# Patient Record
Sex: Male | Born: 1990 | Race: White | Hispanic: No | Marital: Single | Smoking: Former smoker
Health system: Southern US, Community
[De-identification: ages and names within clinical notes are randomized; demographics above are authoritative.]

## PROBLEM LIST (undated history)

## (undated) DIAGNOSIS — J45909 Unspecified asthma, uncomplicated: Secondary | ICD-10-CM

## (undated) DIAGNOSIS — B019 Varicella without complication: Secondary | ICD-10-CM

## (undated) DIAGNOSIS — K589 Irritable bowel syndrome without diarrhea: Secondary | ICD-10-CM

## (undated) DIAGNOSIS — J302 Other seasonal allergic rhinitis: Secondary | ICD-10-CM

## (undated) DIAGNOSIS — G43909 Migraine, unspecified, not intractable, without status migrainosus: Secondary | ICD-10-CM

## (undated) DIAGNOSIS — K219 Gastro-esophageal reflux disease without esophagitis: Secondary | ICD-10-CM

## (undated) HISTORY — DX: Migraine, unspecified, not intractable, without status migrainosus: G43.909

## (undated) HISTORY — DX: Other seasonal allergic rhinitis: J30.2

## (undated) HISTORY — DX: Unspecified asthma, uncomplicated: J45.909

## (undated) HISTORY — DX: Gastro-esophageal reflux disease without esophagitis: K21.9

## (undated) HISTORY — DX: Varicella without complication: B01.9

## (undated) HISTORY — PX: ELBOW SURGERY: SHX618

## (undated) HISTORY — DX: Irritable bowel syndrome without diarrhea: K58.9

---
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
## 2012-04-19 ENCOUNTER — Emergency Department: Payer: Self-pay | Admitting: Emergency Medicine

## 2012-07-23 ENCOUNTER — Emergency Department: Payer: Self-pay | Admitting: Emergency Medicine

## 2012-11-02 ENCOUNTER — Ambulatory Visit: Payer: Self-pay | Admitting: Internal Medicine

## 2017-05-28 ENCOUNTER — Ambulatory Visit: Payer: Self-pay | Admitting: Primary Care

## 2017-06-15 ENCOUNTER — Encounter (INDEPENDENT_AMBULATORY_CARE_PROVIDER_SITE_OTHER): Payer: Self-pay

## 2017-06-15 ENCOUNTER — Ambulatory Visit (INDEPENDENT_AMBULATORY_CARE_PROVIDER_SITE_OTHER): Payer: BLUE CROSS/BLUE SHIELD | Admitting: Primary Care

## 2017-06-15 ENCOUNTER — Encounter: Payer: Self-pay | Admitting: Primary Care

## 2017-06-15 VITALS — BP 124/80 | HR 90 | Temp 98.6°F | Ht 68.5 in | Wt 155.8 lb

## 2017-06-15 DIAGNOSIS — J4520 Mild intermittent asthma, uncomplicated: Secondary | ICD-10-CM | POA: Insufficient documentation

## 2017-06-15 DIAGNOSIS — K582 Mixed irritable bowel syndrome: Secondary | ICD-10-CM | POA: Insufficient documentation

## 2017-06-15 DIAGNOSIS — K219 Gastro-esophageal reflux disease without esophagitis: Secondary | ICD-10-CM | POA: Diagnosis not present

## 2017-06-15 MED ORDER — ALBUTEROL SULFATE HFA 108 (90 BASE) MCG/ACT IN AERS: 2.0000 | INHALATION_SPRAY | RESPIRATORY_TRACT | 0 refills | Status: DC | PRN

## 2017-06-15 MED ORDER — DICYCLOMINE HCL 10 MG PO CAPS: 10.0000 mg | ORAL_CAPSULE | Freq: Three times a day (TID) | ORAL | 0 refills | Status: DC

## 2017-06-15 NOTE — Patient Instructions (Addendum)
Shortness of Breath/Wheezing/Cough: Use the albuterol inhaler. Inhale 2 puffs into the lungs every 4 to six hours as needed for wheezing, cough, and/or shortness of breath.   Try dicyclomine (Bentyl) 10 mg tablets. Take 1 tablet by mouth just prior to meals as needed for stomach upset.   Start probiotics as discussed.  Please update me in a few weeks if no improvement.  It was a pleasure to meet you today! Please don't hesitate to call or message me with any questions. Welcome to [facility]!   Food Choices for Gastroesophageal Reflux Disease, Adult When you have gastroesophageal reflux disease (GERD), the foods you eat and your eating habits are very important. Choosing the right foods can help ease your discomfort. What guidelines do I need to follow?  Choose fruits, vegetables, whole grains, and low-fat dairy products.  Choose low-fat meat, fish, and poultry.  Limit fats such as oils, salad dressings, butter, nuts, and avocado.  Keep a food diary. This helps you identify foods that cause symptoms.  Avoid foods that cause symptoms. These may be different for everyone.  Eat small meals often instead of 3 large meals a day.  Eat your meals slowly, in a place where you are relaxed.  Limit fried foods.  Cook foods using methods other than frying.  Avoid drinking alcohol.  Avoid drinking large amounts of liquids with your meals.  Avoid bending over or lying down until 2-3 hours after eating. What foods are not recommended? These are some foods and drinks that may make your symptoms worse: Vegetables Tomatoes. Tomato juice. Tomato and spaghetti sauce. Chili peppers. Onion and garlic. Horseradish. Fruits Oranges, grapefruit, and lemon (fruit and juice). Meats High-fat meats, fish, and poultry. This includes hot dogs, ribs, ham, sausage, salami, and bacon. Dairy Whole milk and chocolate milk. Sour cream. Cream. Butter. Ice cream. Cream cheese. Drinks Coffee and tea. Bubbly  (carbonated) drinks or energy drinks. Condiments Hot sauce. Barbecue sauce. Sweets/Desserts Chocolate and cocoa. Donuts. Peppermint and spearmint. Fats and Oils High-fat foods. This includes [place] fries and potato chips. Other Vinegar. Strong spices. This includes black pepper, white pepper, red pepper, cayenne, curry powder, cloves, ginger, and chili powder. The items listed above may not be a complete list of foods and drinks to avoid. Contact your dietitian for more information. This information is not intended to replace advice given to you by your health care provider. Make sure you discuss any questions you have with your health care provider. Document Released: 10/20/2011 Document Revised: 09/26/2015 Document Reviewed: 02/22/2013 Elsevier Interactive Patient Education  2017 [employer]

## 2017-06-15 NOTE — Assessment & Plan Note (Signed)
Discussed to stop chewing tobacco, handout provided regarding triggers for GERD. Continue Nexium for now, plan is to wean down once he works on diet and tobacco use.

## 2017-06-15 NOTE — Assessment & Plan Note (Signed)
Infrequent symptoms, no wheezing on exam. Rx for albuterol sent to pharmacy to use PRN.

## 2017-06-15 NOTE — Assessment & Plan Note (Addendum)
Symptoms strongly resemble IBS, doubt diverticulitis, appendicitis, gall bladder involvement based off of HPI and exam. He appears well.  Will trial course of dicyclomine to use prior to meals. Also start probiotics. He will update in a few weeks.  Exam unremarkable.  Care Everywhere reviewed from GI visit, looks like he tested negative for Celiac and H. Pylori. He was encouraged to start omeprazole 20 mg and Levisin PRN.

## 2017-06-15 NOTE — Progress Notes (Signed)
Subjective:    Patient ID: Joshua Wang, male    DOB: [birth year], 27 y.o.   MRN: [id number]  HPI  Mr. Joshua Wang is a 27 year old male who presents today to establish care and discuss the problems mentioned below. Will obtain old records.  1) Asthma: Intermittent, diagnosed as a child. He does not have an albuterol inhaler on hand, has not had one in several years. He does experience intermittent shortness of breath with chest tightness for which he'll need an inahler.   2) Migraines: Intermittent since high school. Migraines are typically located to the right parietal lobe with radiation to right occipital lobe. He will also experience right sided numbness and right blurry vision with migraines. He will take ibuprofen and sleep for treatment. No recent migraine.  3) Chronic Abdominal Pain: Intermittent for years and is mostly located to the bilateral lower quadrants. Symptoms will occur with and without regards to food, but does sometimes find himself having to have a bowel movement after meals.  Symptoms will occur several times monthly on average, sometimes experiences nausea, constipation, diarrhea, bloating gas. He was seen by GI one year ago and was told to start probiotics and Miralax which caused is pain to become worse. He never followed up. He was told that his symptoms were similar to IBS, no formal diagnosis.   He denies vomiting, belching. He does endorse esophageal burning and was once taking Tum's regularly. He's now taking Nexium OTC once daily with improvement to esophageal burning. He does chewing tobacco.  Diet currently consists of:  Breakfast: 12 pm-1pm left overs (casseroles, grilled chicken, vegetables). Lunch: 9 pm left overs, fast food on the weekends Dinner: 3 am-4am left overs, fast food on the weekends Snacks: Chocolate, cakes, cookies Desserts: Daily Beverages: Water, soda, sweet tea, juice weekly, alcohol once weekly    Review of Systems  Constitutional:  Negative for fever and unexpected weight change.  Gastrointestinal: Positive for abdominal pain, constipation, diarrhea and nausea. Negative for vomiting.       Intermittent abdominal pain. GERD  Neurological:       Chronic, infrequent migraines  Psychiatric/Behavioral: The patient is not nervous/anxious.        Past Medical History:  Diagnosis Date  . Asthma   . Chickenpox   . GERD (gastroesophageal reflux disease)   . IBS (irritable bowel syndrome)   . Migraines   . Seasonal allergies      Social History   Socioeconomic History  . Marital status: Single    Spouse name: Not on file  . Number of children: Not on file  . Years of education: Not on file  . Highest education level: Not on file  Social Needs  . Financial resource strain: Not on file  . Food insecurity - worry: Not on file  . Food insecurity - inability: Not on file  . Transportation needs - medical: Not on file  . Transportation needs - non-medical: Not on file  Occupational History  . Not on file  Tobacco Use  . Smoking status: Former Games developermoker  . Smokeless tobacco: Current User    Types: Chew  Substance and Sexual Activity  . Alcohol use: Yes  . Drug use: Not on file  . Sexual activity: Not on file  Other Topics Concern  . Not on file  Social History Narrative   Single.   No children.   Works at [employer], nights.   Enjoys relaxing.       Family History  Problem Relation Age of Onset  . Asthma Mother   . Depression Mother   . Diabetes Father   . Hypertension Father   . Diabetes Maternal Grandfather   . Hearing loss Maternal Grandfather   . Cancer Paternal Grandmother   . Cancer Paternal Grandfather   . Diabetes Paternal Grandfather     No Known Allergies  Current Outpatient Medications on File Prior to Visit  Medication Sig Dispense Refill  . esomeprazole (NEXIUM) 20 MG capsule Take 20 mg by mouth daily at 12 noon.    . fexofenadine (ALLEGRA) 60 MG tablet Take by mouth.     No  current facility-administered medications on file prior to visit.     BP 124/80   Pulse 90   Temp 98.6 F (37 C) (Oral)   Ht 5' 8.5" (1.74 m)   Wt 155 lb 12 oz (70.6 kg)   SpO2 98%   BMI 23.34 kg/m    Objective:   Physical Exam  Constitutional: He is oriented to person, place, and time. He appears well-nourished.  Neck: Neck supple.  Cardiovascular: Normal rate and regular rhythm.  Pulmonary/Chest: Effort normal and breath sounds normal. He has no wheezes. He has no rales.  Abdominal: Soft. Bowel sounds are normal. There is no tenderness.  Neurological: He is alert and oriented to person, place, and time.  Skin: Skin is warm and dry.  Psychiatric: He has a normal mood and affect.          Assessment & Plan:

## 2017-08-08 ENCOUNTER — Other Ambulatory Visit: Payer: Self-pay | Admitting: Primary Care

## 2017-08-08 DIAGNOSIS — K582 Mixed irritable bowel syndrome: Secondary | ICD-10-CM

## 2019-02-28 ENCOUNTER — Ambulatory Visit
Admission: EM | Admit: 2019-02-28 | Discharge: 2019-02-28 | Disposition: A | Discharge status: Home / Self Care | Payer: BC Managed Care – PPO | Attending: Urgent Care | Admitting: Urgent Care

## 2019-02-28 ENCOUNTER — Encounter: Payer: Self-pay | Admitting: Emergency Medicine

## 2019-02-28 ENCOUNTER — Other Ambulatory Visit: Payer: Self-pay

## 2019-02-28 DIAGNOSIS — M791 Myalgia, unspecified site: Secondary | ICD-10-CM | POA: Diagnosis not present

## 2019-02-28 DIAGNOSIS — R05 Cough: Secondary | ICD-10-CM | POA: Diagnosis not present

## 2019-02-28 DIAGNOSIS — R197 Diarrhea, unspecified: Secondary | ICD-10-CM

## 2019-02-28 DIAGNOSIS — Z7189 Other specified counseling: Secondary | ICD-10-CM

## 2019-02-28 DIAGNOSIS — J029 Acute pharyngitis, unspecified: Secondary | ICD-10-CM

## 2019-02-28 DIAGNOSIS — B349 Viral infection, unspecified: Secondary | ICD-10-CM

## 2019-02-28 DIAGNOSIS — Z20828 Contact with and (suspected) exposure to other viral communicable diseases: Secondary | ICD-10-CM

## 2019-02-28 DIAGNOSIS — J3489 Other specified disorders of nose and nasal sinuses: Secondary | ICD-10-CM | POA: Diagnosis not present

## 2019-02-28 DIAGNOSIS — Z20822 Contact with and (suspected) exposure to covid-19: Secondary | ICD-10-CM

## 2019-02-28 DIAGNOSIS — J069 Acute upper respiratory infection, unspecified: Secondary | ICD-10-CM | POA: Diagnosis present

## 2019-02-28 LAB — RAPID STREP SCREEN (MED CTR MEBANE ONLY): Streptococcus, Group A Screen (Direct): NEGATIVE

## 2019-02-28 NOTE — ED Triage Notes (Signed)
Patient c/o sore throat, nasal drainage and body aches that started 2 days ago. Denies fever.

## 2019-02-28 NOTE — ED Provider Notes (Signed)
[place], [place]   Name: Joshua Wang DOB: [birth year] MRN: [id number] CSN: [id number] PCP: Patient, No Pcp Per  Arrival date and time:  02/28/19 1755  Chief Complaint:  Sore Throat, Nasal Congestion, and Generalized Body Aches   NOTE: Prior to seeing the patient today, I have reviewed the triage nursing documentation and vital signs. Clinical staff has updated patient's PMH/PSHx, current medication list, and drug allergies/intolerances to ensure comprehensive history available to assist in medical decision making.   History:   HPI: Joshua R Beazer is a 28 y.o. male who presents today with complaints of congestion, sore throat, and generalized myalgias that began with acute onset x 2 days ago. Patient denies any cough or fevers. He has a generalized headache. He denies that he has experienced any nausea, vomiting, or abdominal pain. He has had some mild diarrhea. He is eating and drinking well. Patient has a PMH that is significant for seasonal allergies; takes fexofenadine daily. Patient denies any perceived alterations to his sense of taste or smell. Patient denies being in close contact with anyone known to be ill. He has never been tested for SARS-CoV-2 (novel coronavirus) per his report. In efforts to conservatively manage his symptoms at home, the patient notes that he has used Nyquil, which has helped to improve his symptoms.   Past Medical History:  Diagnosis Date  . Asthma   . Chickenpox   . GERD (gastroesophageal reflux disease)   . IBS (irritable bowel syndrome)   . Migraines   . Seasonal allergies     Past Surgical History:  Procedure Laterality Date  . ELBOW SURGERY Right     Family History  Problem Relation Age of Onset  . Asthma Mother   . Depression Mother   . Diabetes Father   . Hypertension Father   . Diabetes Maternal Grandfather   . Hearing loss Maternal Grandfather   . Cancer Paternal Grandmother   . Cancer Paternal Grandfather   . Diabetes Paternal  Grandfather     Social History   Tobacco Use  . Smoking status: Former Games developermoker  . Smokeless tobacco: Current User    Types: Chew  Substance Use Topics  . Alcohol use: Yes  . Drug use: Not on file    Patient Active Problem List   Diagnosis Date Noted  . Viral upper respiratory tract infection 02/28/2019  . Mild intermittent asthma without complication 06/15/2017  . Irritable bowel syndrome with both constipation and diarrhea 06/15/2017  . GERD (gastroesophageal reflux disease) 06/15/2017    Home Medications:    Current Meds  Medication Sig  . fexofenadine (ALLEGRA) 60 MG tablet Take by mouth.    Allergies:   Patient has no known allergies.  Review of Systems (ROS): Review of Systems  Constitutional: Negative for fatigue and fever.  HENT: Positive for postnasal drip, rhinorrhea and sore throat. Negative for congestion, ear pain, sinus pressure, sinus pain and sneezing.   Eyes: Negative for pain, discharge and redness.  Respiratory: Positive for cough. Negative for chest tightness and shortness of breath.   Cardiovascular: Negative for chest pain and palpitations.  Gastrointestinal: Positive for diarrhea. Negative for abdominal pain, nausea and vomiting.  Musculoskeletal: Positive for myalgias. Negative for arthralgias, back pain and neck pain.  Skin: Negative for color change, pallor and rash.  Allergic/Immunologic: Positive for environmental allergies (seasonal).  Neurological: Positive for headaches. Negative for dizziness, syncope and weakness.  Hematological: Negative for adenopathy.     Vital Signs: Today's Vitals   02/28/19  1805 02/28/19 1806 02/28/19 1809 02/28/19 1847  BP:   118/70   Pulse:   84   Resp:   18   Temp:   98.5 F (36.9 C)   TempSrc:   Oral   SpO2:   100%   Weight:  160 lb (72.6 kg)    Height:  5\' 9"  (1.753 m)    PainSc: 2    2     Physical Exam: Physical Exam  Constitutional: He is oriented to person, place, and time and  well-developed, well-nourished, and in no distress. No distress.  HENT:  Head: Normocephalic and atraumatic.  Right Ear: Tympanic membrane normal.  Left Ear: Tympanic membrane normal.  Nose: Rhinorrhea present. No mucosal edema or sinus tenderness.  Mouth/Throat: Uvula is midline and mucous membranes are normal. Posterior oropharyngeal erythema (+) clear PND present. No posterior oropharyngeal edema.  Eyes: Pupils are equal, round, and reactive to light. Conjunctivae and EOM are normal.  Neck: Normal range of motion. Neck supple.  Cardiovascular: Normal rate, regular rhythm, normal heart sounds and intact distal pulses. Exam reveals no gallop and no friction rub.  No murmur heard. Pulmonary/Chest: Effort normal. No respiratory distress. He has no decreased breath sounds. He has no wheezes. He has no rhonchi. He has no rales.  Abdominal: Soft. Normal appearance and bowel sounds are normal. He exhibits no distension. There is no abdominal tenderness.  Musculoskeletal: Normal range of motion.  Neurological: He is alert and oriented to person, place, and time. Gait normal.  Skin: Skin is warm and dry. No rash noted. He is not diaphoretic.  Psychiatric: Mood, memory, affect and judgment normal.  Nursing note and vitals reviewed.   Urgent Care Treatments / Results:   LABS: PLEASE NOTE: all labs that were ordered this encounter are listed, however only abnormal results are displayed. Labs Reviewed  RAPID STREP SCREEN ([facility] ONLY)  NOVEL CORONAVIRUS, NAA (HOSP ORDER, SEND-OUT TO REF LAB; TAT 18-24 HRS)  CULTURE, GROUP A STREP [facility])    EKG: -None  RADIOLOGY: No results found.  PROCEDURES: Procedures  MEDICATIONS RECEIVED THIS VISIT: Medications - No data to display  PERTINENT CLINICAL COURSE NOTES/UPDATES:   Initial Impression / Assessment and Plan / Urgent Care Course:  Pertinent labs & imaging results that were available during my care of the patient were personally  reviewed by me and considered in my medical decision making (see lab/imaging section of note for values and interpretations).  [facility] R Hyslop is a 28 y.o. male who presents to [facility] today with complaints of Sore Throat, Nasal Congestion, and Generalized Body Aches   Patient overall well appearing and in no acute distress today in clinic. Presenting symptoms (see HPI) and exam as documented above. He presents with symptoms associated with SARS-CoV-2 (novel coronavirus). Discussed typical symptom constellation. Reviewed potential for infection and need for testing. Patient amenable to being tested. SARS-CoV-2 swab collected by certified clinical staff. Discussed variable turn around times associated with testing, as swabs are being processed at [facility], and have been taking between 2-5 days to come back. He was advised to self quarantine, per [facility] DHHS guidelines, until negative results received.   Rapid streptococcal throat swab (-); reflex culture sent. Symptoms consistent with acute viral illness with cough. Until ruled out with confirmatory lab testing, SARS-CoV-2 remains part of the differential. His testing is pending at this time. I discussed with him that his symptoms are felt to be viral in nature, thus antibiotics would not  offer him any relief or improve his symptoms any faster than conservative symptomatic management. Discussed supportive care measures at home during acute phase of illness. Patient to rest as much as possible. He was encouraged to ensure adequate hydration (water and ORS) to prevent dehydration and electrolyte derangements. Patient may use APAP and/or IBU on an as needed basis for pain/fever.   Current clinical condition warrants patient being out of work in order to quarantine while waiting for testing results. He was provided with the appropriate documentation to provide to his place of employment that will allow for him to RTW on 03/03/2019 with no restrictions. RTW  is contingent on his SARS-CoV-2 test results being reviewed as negative.   Discussed follow up with primary care physician in 1 week for re-evaluation. Patient reporting that he does not have a PCP at this time. In review of his EMR, it appears as if he has been seen at both [facility] and [place]. Patient requesting to be provided with the name of a local provider in order to establish care citing that he needed a CPE/AWV with routine labs. Name and office contact information provided for Dr. Adrian Prows. He was advised that he would need to contact the office to schedule an appointment to be seen. I have reviewed the follow up and strict return precautions for any new or worsening symptoms. Patient is aware of symptoms that would be deemed urgent/emergent, and would thus require further evaluation either here or in the emergency department. At the time of discharge, he verbalized understanding and consent with the discharge plan as it was reviewed with him. All questions were fielded by provider and/or clinic staff prior to patient discharge.    Final Clinical Impressions / Urgent Care Diagnoses:   Final diagnoses:  Encounter for laboratory testing for COVID-19 virus  Advice given about COVID-19 virus infection  Viral illness    New Prescriptions:  [place] Controlled Substance Registry consulted? Not Applicable  No orders of the defined types were placed in this encounter.   Recommended Follow up Care:  Patient encouraged to follow up with the following provider within the specified time frame, or sooner as dictated by the severity of his symptoms. As always, he was instructed that for any urgent/emergent care needs, he should seek care either here or in the emergency department for more immediate evaluation.  Follow-up Information    Call  Leonel Ramsay, MD.   Specialty: Infectious Diseases Why: Need to establish care with a primary care providers. Contact information: [place] [address] [phone]         NOTE: This note was prepared using Dragon dictation software along with smaller phrase technology. Despite my best ability to proofread, there is the potential that transcriptional errors may still occur from this process, and are completely unintentional.    Karen Kitchens, NP 02/28/19 1950

## 2019-02-28 NOTE — Discharge Instructions (Signed)
It was very nice seeing you today in clinic. Thank you for entrusting me with your care.   Symptoms likely viral. Rest and increase fluid intake as much as possible. Water is always best, as sugar and caffeine containing fluids can cause you to become dehydrated. Try to incorporate electrolyte enriched fluids, such as Gatorade or Pedialyte, into your daily fluid intake.    You were tested for SARS-CoV-2 (novel coronavirus) today. Testing is performed by an outside lab ([facility]) and has variable turn around times ranging between 2-5 days. Current recommendations from the the CDC and [place] DHHS require that you remain out of work in order to quarantine at home until negative test results are have been received. In the event that your test results are positive, you will be contacted with further directives. These measures are being implemented out of an abundance of caution to prevent transmission and spread during the current SARS-CoV-2 pandemic.  Make arrangements to establish care within 1 week for re-evaluation if not improving. If your symptoms/condition worsens, please seek follow up care either here or in the ER. Please remember, our [facility] providers are "right here with you" when you need [place].   Again, it was my pleasure to take care of you today. Thank you for choosing our clinic. I hope that you start to feel better quickly.   Honor Loh, MSN, APRN, FNP-C, CEN Advanced Practice Provider [facility]

## 2019-03-02 LAB — NOVEL CORONAVIRUS, NAA (HOSP ORDER, SEND-OUT TO REF LAB; TAT 18-24 HRS): SARS-CoV-2, NAA: NOT DETECTED

## 2019-03-03 LAB — CULTURE, GROUP A STREP (THRC)

## 2019-04-14 ENCOUNTER — Encounter: Payer: Self-pay | Admitting: Primary Care

## 2019-04-14 ENCOUNTER — Other Ambulatory Visit: Payer: Self-pay

## 2019-04-14 ENCOUNTER — Ambulatory Visit (INDEPENDENT_AMBULATORY_CARE_PROVIDER_SITE_OTHER): Payer: BC Managed Care – PPO | Admitting: Primary Care

## 2019-04-14 VITALS — BP 120/82 | HR 95 | Temp 97.0°F | Ht 69.0 in | Wt 159.5 lb

## 2019-04-14 DIAGNOSIS — F329 Major depressive disorder, single episode, unspecified: Secondary | ICD-10-CM | POA: Insufficient documentation

## 2019-04-14 DIAGNOSIS — Z23 Encounter for immunization: Secondary | ICD-10-CM | POA: Diagnosis not present

## 2019-04-14 DIAGNOSIS — J4520 Mild intermittent asthma, uncomplicated: Secondary | ICD-10-CM

## 2019-04-14 DIAGNOSIS — Z0000 Encounter for general adult medical examination without abnormal findings: Secondary | ICD-10-CM

## 2019-04-14 DIAGNOSIS — F331 Major depressive disorder, recurrent, moderate: Secondary | ICD-10-CM | POA: Diagnosis not present

## 2019-04-14 DIAGNOSIS — Z113 Encounter for screening for infections with a predominantly sexual mode of transmission: Secondary | ICD-10-CM | POA: Diagnosis not present

## 2019-04-14 DIAGNOSIS — Z0001 Encounter for general adult medical examination with abnormal findings: Secondary | ICD-10-CM | POA: Insufficient documentation

## 2019-04-14 MED ORDER — BUPROPION HCL ER (XL) 150 MG PO TB24: 150.0000 mg | ORAL_TABLET | Freq: Every day | ORAL | 1 refills | Status: DC

## 2019-04-14 NOTE — Patient Instructions (Signed)
Contact your therapist regarding an appointment.  Start bupropion XL (Wellbutrin) 150 mg tablets for depression. Take this once daily in the morning.   Continue exercising. You should be getting 150 minutes of moderate intensity exercise weekly.  It's important to improve your diet by reducing consumption of fast food, fried food, processed snack foods, sugary drinks. Increase consumption of fresh vegetables and fruits, whole grains, water.  Ensure you are drinking 64 ounces of water daily.  Please schedule a follow up visit for 6 weeks as discussed.   It was a pleasure to see you today!   Preventive Care 67-74 Years Old, Male Preventive care refers to lifestyle choices and visits with your health care provider that can promote health and wellness. This includes:  A yearly physical exam. This is also called an annual well check.  Regular dental and eye exams.  Immunizations.  Screening for certain conditions.  Healthy lifestyle choices, such as eating a healthy diet, getting regular exercise, not using drugs or products that contain nicotine and tobacco, and limiting alcohol use. What can I expect for my preventive care visit? Physical exam Your health care provider will check:  Height and weight. These may be used to calculate body mass index (BMI), which is a measurement that tells if you are at a healthy weight.  Heart rate and blood pressure.  Your skin for abnormal spots. Counseling Your health care provider may ask you questions about:  Alcohol, tobacco, and drug use.  Emotional well-being.  Home and relationship well-being.  Sexual activity.  Eating habits.  Work and work Statistician. What immunizations do I need?  Influenza (flu) vaccine  This is recommended every year. Tetanus, diphtheria, and pertussis (Tdap) vaccine  You may need a Td booster every 10 years. Varicella (chickenpox) vaccine  You may need this vaccine if you have not already been  vaccinated. Human papillomavirus (HPV) vaccine  If recommended by your health care provider, you may need three doses over 6 months. Measles, mumps, and rubella (MMR) vaccine  You may need at least one dose of MMR. You may also need a second dose. Meningococcal conjugate (MenACWY) vaccine  One dose is recommended if you are 66-76 years old and a Market researcher living in a residence hall, or if you have one of several medical conditions. You may also need additional booster doses. Pneumococcal conjugate (PCV13) vaccine  You may need this if you have certain conditions and were not previously vaccinated. Pneumococcal polysaccharide (PPSV23) vaccine  You may need one or two doses if you smoke cigarettes or if you have certain conditions. Hepatitis A vaccine  You may need this if you have certain conditions or if you travel or work in places where you may be exposed to hepatitis A. Hepatitis B vaccine  You may need this if you have certain conditions or if you travel or work in places where you may be exposed to hepatitis B. Haemophilus influenzae type b (Hib) vaccine  You may need this if you have certain risk factors. You may receive vaccines as individual doses or as more than one vaccine together in one shot (combination vaccines). Talk with your health care provider about the risks and benefits of combination vaccines. What tests do I need? Blood tests  Lipid and cholesterol levels. These may be checked every 5 years starting at age 59.  Hepatitis C test.  Hepatitis B test. Screening   Diabetes screening. This is done by checking your blood sugar (glucose) after  you have not eaten for a while (fasting).  Sexually transmitted disease (STD) testing. Talk with your health care provider about your test results, treatment options, and if necessary, the need for more tests. Follow these instructions at home: Eating and drinking   Eat a diet that includes fresh  fruits and vegetables, whole grains, lean protein, and low-fat dairy products.  Take vitamin and mineral supplements as recommended by your health care provider.  Do not drink alcohol if your health care provider tells you not to drink.  If you drink alcohol: ? Limit how much you have to 0-2 drinks a day. ? Be aware of how much alcohol is in your drink. In the [place] one drink equals one 12 oz bottle of beer (355 mL), one 5 oz glass of wine (148 mL), or one 1 oz glass of hard liquor (44 mL). Lifestyle  Take daily care of your teeth and gums.  Stay active. Exercise for at least 30 minutes on 5 or more days each week.  Do not use any products that contain nicotine or tobacco, such as cigarettes, e-cigarettes, and chewing tobacco. If you need help quitting, ask your health care provider.  If you are sexually active, practice safe sex. Use a condom or other form of protection to prevent STIs (sexually transmitted infections). What's next?  Go to your health care provider once a year for a well check visit.  Ask your health care provider how often you should have your eyes and teeth checked.  Stay up to date on all vaccines. This information is not intended to replace advice given to you by your health care provider. Make sure you discuss any questions you have with your health care provider. Document Released: 06/16/2001 Document Revised: 04/14/2018 Document Reviewed: 04/14/2018 Elsevier Patient Education  2020 [employer].

## 2019-04-14 NOTE — Assessment & Plan Note (Signed)
Tetanus due, provided today. Declines influenza vaccination. Encouraged regular exercise, healthy diet.  Exam today unremarkable. Labs pending.

## 2019-04-14 NOTE — Addendum Note (Signed)
Addended by: Jacqualin Combes on: 04/14/2019 04:31 PM   Modules accepted: Orders

## 2019-04-14 NOTE — Assessment & Plan Note (Signed)
Infrequent, using albuterol PRN. Continue to monitor, no wheezing on exam.

## 2019-04-14 NOTE — Assessment & Plan Note (Signed)
Chronic for years, worse over last 1-2 years. PHQ 9 score of 21 today, contracts to safety.   He will get back in contact with his therapist. He does agree to medication. Given history of chewing tobacco abuse we will initiate Wellbutrin XL 150 mg.   We discussed possible side effects of headache, GI upset, drowsiness, and SI/HI. If thoughts of SI/HI develop, we discussed to present to the emergency immediately. Patient verbalized understanding.   Follow up in 6 weeks for re-evaluation.

## 2019-04-14 NOTE — Progress Notes (Signed)
Subjective:    Patient ID: Joshua Wang, male    DOB: Dec 24, 1990, 28 y.o.   MRN: [id number]  HPI  Joshua Wang is a 28 year old male who presents today for complete physical.  He would also like to discuss depression. Chronic for the last five years, previously meeting with therapy and did well, never managed on medications. His last visit with his therapist was nearly two years ago. Symptoms include feeling down/sad, little interest in doing things, fatigue, difficulty sleeping. He has had suicidal thoughts, no plan and contracts to safety. He does chew tobacco and would like to quit.   Immunizations: -Tetanus: No recent tetanus -Influenza: Declines  -HPV: Never completed   Diet: He endorses a fair diet. Mix of both take out and home cooked meals. Exercise: He is active at work, working out at [employer] 6 days weekly.  Eye exam: No recent exam Dental exam: Completes annually     Review of Systems  Constitutional: Negative for unexpected weight change.  HENT: Negative for rhinorrhea.   Respiratory: Negative for cough and shortness of breath.   Cardiovascular: Negative for chest pain.  Gastrointestinal: Negative for constipation and diarrhea.  Genitourinary: Negative for difficulty urinating.  Musculoskeletal: Negative for arthralgias and myalgias.  Skin: Negative for rash.  Allergic/Immunologic: Negative for environmental allergies.  Neurological: Negative for dizziness, numbness and headaches.  Psychiatric/Behavioral: The patient is nervous/anxious.        Past Medical History:  Diagnosis Date  . Asthma   . Chickenpox   . GERD (gastroesophageal reflux disease)   . IBS (irritable bowel syndrome)   . Migraines   . Seasonal allergies      Social History   Socioeconomic History  . Marital status: Single    Spouse name: Not on file  . Number of children: Not on file  . Years of education: Not on file  . Highest education level: Not on file  Occupational History  .  Not on file  Tobacco Use  . Smoking status: Former Games developer  . Smokeless tobacco: Current User    Types: Chew  Substance and Sexual Activity  . Alcohol use: Yes  . Drug use: Not on file  . Sexual activity: Not on file  Other Topics Concern  . Not on file  Social History Narrative   Single.   No children.   Works at [employer], nights.   Enjoys relaxing.    Social Determinants of Health   Financial Resource Strain:   . Difficulty of Paying Living Expenses: Not on file  Food Insecurity:   . Worried About Programme researcher, broadcasting/film/video in the Last Year: Not on file  . Ran Out of Food in the Last Year: Not on file  Transportation Needs:   . Lack of Transportation (Medical): Not on file  . Lack of Transportation (Non-Medical): Not on file  Physical Activity:   . Days of Exercise per Week: Not on file  . Minutes of Exercise per Session: Not on file  Stress:   . Feeling of Stress : Not on file  Social Connections:   . Frequency of Communication with Friends and Family: Not on file  . Frequency of Social Gatherings with Friends and Family: Not on file  . Attends Religious Services: Not on file  . Active Member of Clubs or Organizations: Not on file  . Attends Banker Meetings: Not on file  . Marital Status: Not on file  Intimate Partner Violence:   .  Fear of Current or Ex-Partner: Not on file  . Emotionally Abused: Not on file  . Physically Abused: Not on file  . Sexually Abused: Not on file    Past Surgical History:  Procedure Laterality Date  . ELBOW SURGERY Right     Family History  Problem Relation Age of Onset  . Asthma Mother   . Depression Mother   . Diabetes Father   . Hypertension Father   . Diabetes Maternal Grandfather   . Hearing loss Maternal Grandfather   . Cancer Paternal Grandmother   . Cancer Paternal Grandfather   . Diabetes Paternal Grandfather     No Known Allergies  Current Outpatient Medications on File Prior to Visit  Medication Sig  Dispense Refill  . fexofenadine (ALLEGRA) 60 MG tablet Take by mouth.    . [DISCONTINUED] albuterol (PROAIR HFA) 108 (90 Base) MCG/ACT inhaler Inhale 2 puffs into the lungs every 4 (four) hours as needed for wheezing or shortness of breath. 1 Inhaler 0  . [DISCONTINUED] dicyclomine (BENTYL) 10 MG capsule TAKE 1 CAPSULE BY MOUTH 3 TIMES DAILY BEFORE MEALS. 90 capsule 0  . [DISCONTINUED] esomeprazole (NEXIUM) 20 MG capsule Take 20 mg by mouth daily at 12 noon.     No current facility-administered medications on file prior to visit.    BP 120/82   Pulse 95   Temp (!) 97 F (36.1 C) (Temporal)   Ht 5\' 9"  (1.753 m)   Wt 159 lb 8 oz (72.3 kg)   SpO2 98%   BMI 23.55 kg/m    Objective:   Physical Exam  Constitutional: He is oriented to person, place, and time. He appears well-nourished.  HENT:  Right Ear: Tympanic membrane and ear canal normal.  Left Ear: Tympanic membrane and ear canal normal.  Mouth/Throat: Oropharynx is clear and moist.  Eyes: Pupils are equal, round, and reactive to light. EOM are normal.  Cardiovascular: Normal rate and regular rhythm.  Respiratory: Effort normal and breath sounds normal.  GI: Soft. Bowel sounds are normal. There is no abdominal tenderness.  Musculoskeletal:        General: Normal range of motion.     Cervical back: Neck supple.  Neurological: He is alert and oriented to person, place, and time. No cranial nerve deficit.  Reflex Scores:      Patellar reflexes are 2+ on the right side and 2+ on the left side. Skin: Skin is warm and dry.  Psychiatric: He has a normal mood and affect.           Assessment & Plan:

## 2019-04-18 LAB — COMPREHENSIVE METABOLIC PANEL
AG Ratio: 2.2 (calc) (ref 1.0–2.5)
ALT: 15 U/L (ref 9–46)
AST: 16 U/L (ref 10–40)
Albumin: 4.8 g/dL (ref 3.6–5.1)
Alkaline phosphatase (APISO): 69 U/L (ref 36–130)
BUN/Creatinine Ratio: 10 (calc) (ref 6–22)
BUN: 14 mg/dL (ref 7–25)
CO2: 26 mmol/L (ref 20–32)
Calcium: 9.7 mg/dL (ref 8.6–10.3)
Chloride: 103 mmol/L (ref 98–110)
Creat: 1.38 mg/dL — ABNORMAL HIGH (ref 0.60–1.35)
Globulin: 2.2 g/dL (calc) (ref 1.9–3.7)
Glucose, Bld: 93 mg/dL (ref 65–99)
Potassium: 4.4 mmol/L (ref 3.5–5.3)
Sodium: 139 mmol/L (ref 135–146)
Total Bilirubin: 2.4 mg/dL — ABNORMAL HIGH (ref 0.2–1.2)
Total Protein: 7 g/dL (ref 6.1–8.1)

## 2019-04-18 LAB — HSV 1/2 AB (IGM), IFA W/RFLX TITER
HSV 1 IgM Screen: NEGATIVE
HSV 2 IgM Screen: NEGATIVE

## 2019-04-18 LAB — C. TRACHOMATIS/N. GONORRHOEAE RNA
C. trachomatis RNA, TMA: NOT DETECTED
N. gonorrhoeae RNA, TMA: NOT DETECTED

## 2019-04-18 LAB — LIPID PANEL
Cholesterol: 128 mg/dL (ref <200)
HDL: 43 mg/dL (ref >=40)
LDL Cholesterol (Calc): 72 mg/dL (calc)
Non-HDL Cholesterol (Calc): 85 mg/dL (calc) (ref <130)
Total CHOL/HDL Ratio: 3 (calc) (ref <5.0)
Triglycerides: 47 mg/dL (ref <150)

## 2019-04-18 LAB — HEPATITIS C ANTIBODY
Hepatitis C Ab: NONREACTIVE
SIGNAL TO CUT-OFF: 0.01 (ref <1.00)

## 2019-04-18 LAB — HSV(HERPES SIMPLEX VRS) I + II AB-IGG
HAV 1 IGG,TYPE SPECIFIC AB: <0.9 index
HSV 2 IGG,TYPE SPECIFIC AB: <0.9 index

## 2019-04-18 LAB — RPR: RPR Ser Ql: NONREACTIVE

## 2019-04-18 LAB — HIV ANTIBODY (ROUTINE TESTING W REFLEX): HIV 1&2 Ab, 4th Generation: NONREACTIVE

## 2019-06-27 ENCOUNTER — Ambulatory Visit (INDEPENDENT_AMBULATORY_CARE_PROVIDER_SITE_OTHER): Payer: BC Managed Care – PPO | Admitting: Primary Care

## 2019-06-27 ENCOUNTER — Other Ambulatory Visit: Payer: Self-pay

## 2019-06-27 ENCOUNTER — Ambulatory Visit: Payer: BC Managed Care – PPO | Attending: Internal Medicine

## 2019-06-27 DIAGNOSIS — G8929 Other chronic pain: Secondary | ICD-10-CM | POA: Diagnosis not present

## 2019-06-27 DIAGNOSIS — M25511 Pain in right shoulder: Secondary | ICD-10-CM | POA: Diagnosis not present

## 2019-06-27 DIAGNOSIS — F331 Major depressive disorder, recurrent, moderate: Secondary | ICD-10-CM | POA: Diagnosis not present

## 2019-06-27 DIAGNOSIS — Z20822 Contact with and (suspected) exposure to covid-19: Secondary | ICD-10-CM

## 2019-06-27 MED ORDER — BUPROPION HCL ER (XL) 150 MG PO TB24: 150.0000 mg | ORAL_TABLET | Freq: Every day | ORAL | 3 refills | Status: DC

## 2019-06-27 NOTE — Progress Notes (Signed)
Subjective:    Patient ID: Joshua Wang, male    DOB: 01-12-1991, 29 y.o.   MRN: [id number]  HPI  Virtual Visit via Video Note  I connected with Joshua Wang on 06/27/19 at 11:20 AM EST by a video enabled telemedicine application and verified that I am speaking with the correct person using two identifiers.  Location: Patient: Home Provider: Office   I discussed the limitations of evaluation and management by telemedicine and the availability of in person appointments. The patient expressed understanding and agreed to proceed.  History of Present Illness:  Joshua Wang is a 29 year old male with a history of GERD, MDD, intermittent asthma, IBS who presents today for follow up of depression and a chief complaint of shoulder pain.  Also exposed to Covid-19 six days ago. He has no symptoms. He was switched to a virtual visit for this reason.   1) MDD: He was last evaluated in early December 2020 for CPE with symptoms of chronic depression. Symptoms included feeling sad/down, fatigue, difficulty sleeping. Some suicidal thoughts. PHQ 9 score of 21 so we initiated Wellbutrin XL 150 mg.   Since his last visit he's doing better. He's feeling happier, more positive thoughts, better sleep, reduced nicotine cravings. He does have intermittent suicidal thoughts, about the same as before, contracts to safety. He has not connected with a therapist. Overall he feels better and would like to continue Wellbutrin XL 150 mg.   2) Shoulder Pain: Chronic to the right shoulder since 2013 or 2014, worsening symptoms about one month ago. He describes his pain as a dull ache that is located to the anterior shoulder with radiation up to the right lateral neck. Also with intermittent numbness and tingling down the right upper extremity. He does have sharp pain with abduction, especially posteriorly. He continues to work out at [employer] and has noticed some weakness on the right side. He now cannot sleep on his right  shoulder given the discomfort.   He's been taking Ibuprofen 600 mg daily with temporary improvement. He underwent plain films of the right shoulder in 2014 which were without cause to his symptoms. He would like to pursue MRI.   Observations/Objective:  Decreased ROM with pain to most planes of abduction, especially posterior.   Assessment and Plan:  See problem based charting.  Follow Up Instructions:  Continue Wellbutrin XL 150 mg for depression.  I will be in touch regarding the MRI.  It was a pleasure to see you today! Mayra Reel, NP-C    I discussed the assessment and treatment plan with the patient. The patient was provided an opportunity to ask questions and all were answered. The patient agreed with the plan and demonstrated an understanding of the instructions.   The patient was advised to call back or seek an in-person evaluation if the symptoms worsen or if the condition fails to improve as anticipated.    Doreene Nest, NP    Review of Systems  Musculoskeletal: Positive for arthralgias.       Chronic right shoulder pain  Neurological: Positive for weakness and numbness.  Psychiatric/Behavioral:       See HPI       Past Medical History:  Diagnosis Date  . Asthma   . Chickenpox   . GERD (gastroesophageal reflux disease)   . IBS (irritable bowel syndrome)   . Migraines   . Seasonal allergies      Social History   Socioeconomic History  .  Marital status: Single    Spouse name: Not on file  . Number of children: Not on file  . Years of education: Not on file  . Highest education level: Not on file  Occupational History  . Not on file  Tobacco Use  . Smoking status: Former Research scientist (life sciences)  . Smokeless tobacco: Current User    Types: Chew  Substance and Sexual Activity  . Alcohol use: Yes  . Drug use: Not on file  . Sexual activity: Not on file  Other Topics Concern  . Not on file  Social History Narrative   Single.   No children.   Works at  [employer], nights.   Enjoys relaxing.    Social Determinants of Health   Financial Resource Strain:   . Difficulty of Paying Living Expenses: Not on file  Food Insecurity:   . Worried About Charity fundraiser in the Last Year: Not on file  . Ran Out of Food in the Last Year: Not on file  Transportation Needs:   . Lack of Transportation (Medical): Not on file  . Lack of Transportation (Non-Medical): Not on file  Physical Activity:   . Days of Exercise per Week: Not on file  . Minutes of Exercise per Session: Not on file  Stress:   . Feeling of Stress : Not on file  Social Connections:   . Frequency of Communication with Friends and Family: Not on file  . Frequency of Social Gatherings with Friends and Family: Not on file  . Attends Religious Services: Not on file  . Active Member of Clubs or Organizations: Not on file  . Attends Archivist Meetings: Not on file  . Marital Status: Not on file  Intimate Partner Violence:   . Fear of Current or Ex-Partner: Not on file  . Emotionally Abused: Not on file  . Physically Abused: Not on file  . Sexually Abused: Not on file    Past Surgical History:  Procedure Laterality Date  . ELBOW SURGERY Right     Family History  Problem Relation Age of Onset  . Asthma Mother   . Depression Mother   . Diabetes Father   . Hypertension Father   . Diabetes Maternal Grandfather   . Hearing loss Maternal Grandfather   . Cancer Paternal Grandmother   . Cancer Paternal Grandfather   . Diabetes Paternal Grandfather     No Known Allergies  Current Outpatient Medications on File Prior to Visit  Medication Sig Dispense Refill  . fexofenadine (ALLEGRA) 60 MG tablet Take by mouth.    . [DISCONTINUED] albuterol (PROAIR HFA) 108 (90 Base) MCG/ACT inhaler Inhale 2 puffs into the lungs every 4 (four) hours as needed for wheezing or shortness of breath. 1 Inhaler 0  . [DISCONTINUED] dicyclomine (BENTYL) 10 MG capsule TAKE 1 CAPSULE BY MOUTH  3 TIMES DAILY BEFORE MEALS. 90 capsule 0  . [DISCONTINUED] esomeprazole (NEXIUM) 20 MG capsule Take 20 mg by mouth daily at 12 noon.     No current facility-administered medications on file prior to visit.    There were no vitals taken for this visit.   Objective:   Physical Exam  Constitutional: He is oriented to person, place, and time. He appears well-nourished.  Musculoskeletal:     Right shoulder: Pain present. Decreased range of motion.     Comments: Decreased ROM with pain to most planes of abduction, especially posterior.  Neurological: He is alert and oriented to person, place, and time.  Skin: Skin is warm and dry.  Psychiatric: He has a normal mood and affect.           Assessment & Plan:

## 2019-06-27 NOTE — Assessment & Plan Note (Signed)
Chronic for years, now worse with radiculopathy/numbness, weakness.  Plain films of shoulder from 2014 reviewed and are unremarkable. Given weakness coupled with numbness and increased pain we will proceed with MRI.  Orders placed.

## 2019-06-27 NOTE — Assessment & Plan Note (Signed)
Improved with Wellbutrin XL 150 mg, continue same for now. He will update if anything changes. Refills sent to pharmacy.

## 2019-06-27 NOTE — Patient Instructions (Signed)
Continue Wellbutrin XL 150 mg for depression.  I will be in touch regarding the MRI.  It was a pleasure to see you today! Mayra Reel, NP-C

## 2019-06-28 LAB — NOVEL CORONAVIRUS, NAA: SARS-CoV-2, NAA: NOT DETECTED

## 2019-07-06 ENCOUNTER — Other Ambulatory Visit: Payer: Self-pay

## 2019-07-06 ENCOUNTER — Ambulatory Visit
Admission: RE | Admit: 2019-07-06 | Discharge: 2019-07-06 | Disposition: A | Discharge status: Home / Self Care | Payer: BC Managed Care – PPO | Source: Ambulatory Visit | Attending: Primary Care | Admitting: Primary Care

## 2019-07-06 DIAGNOSIS — G8929 Other chronic pain: Secondary | ICD-10-CM

## 2019-07-10 ENCOUNTER — Telehealth: Payer: Self-pay | Admitting: Primary Care

## 2019-07-10 NOTE — Telephone Encounter (Signed)
Please advise 

## 2019-07-10 NOTE — Telephone Encounter (Signed)
Noted, note placed on My Chart.

## 2019-07-10 NOTE — Telephone Encounter (Signed)
Pt called stating he has been on 2 week leave from work because he was exposed to covid.  Pt never had covid tested neg.  He needs a work note to returned to full duty at work no restrictions.   Please put on my chart

## 2020-05-10 ENCOUNTER — Other Ambulatory Visit: Payer: Self-pay

## 2020-05-10 ENCOUNTER — Other Ambulatory Visit: Payer: BC Managed Care – PPO

## 2020-05-10 DIAGNOSIS — Z20822 Contact with and (suspected) exposure to covid-19: Secondary | ICD-10-CM

## 2020-05-14 LAB — NOVEL CORONAVIRUS, NAA: SARS-CoV-2, NAA: NOT DETECTED

## 2020-06-05 ENCOUNTER — Other Ambulatory Visit: Payer: BC Managed Care – PPO

## 2020-06-05 DIAGNOSIS — Z20822 Contact with and (suspected) exposure to covid-19: Secondary | ICD-10-CM

## 2020-06-06 LAB — SARS-COV-2, NAA 2 DAY TAT

## 2020-06-06 LAB — NOVEL CORONAVIRUS, NAA: SARS-CoV-2, NAA: NOT DETECTED

## 2020-07-16 ENCOUNTER — Ambulatory Visit: Payer: BC Managed Care – PPO | Attending: Internal Medicine

## 2020-07-16 ENCOUNTER — Other Ambulatory Visit (HOSPITAL_COMMUNITY): Payer: Self-pay | Admitting: Internal Medicine

## 2020-07-16 DIAGNOSIS — Z23 Encounter for immunization: Secondary | ICD-10-CM

## 2020-07-16 NOTE — Progress Notes (Signed)
   Covid-19 Vaccination Clinic  Name:  Joshua Wang    MRN: [id number] DOB: Nov 20, 1990  07/16/2020  [name] was observed post Covid-19 immunization for 15 minutes without incident. He was provided with Vaccine Information Sheet and instruction to access the V-Safe system.   [name] was instructed to call 911 with any severe reactions post vaccine: Marland Kitchen Difficulty breathing  . Swelling of face and throat  . A fast heartbeat  . A bad rash all over body  . Dizziness and weakness   Immunizations Administered    Name Date Dose VIS Date Route   PFIZER Comrnaty(Gray TOP) Covid-19 Vaccine 07/16/2020 12:03 PM 0.3 mL 04/11/2020 Intramuscular   Manufacturer: [employer], [employer]   Lot: [id number]   NDC: [phone]

## 2020-10-23 ENCOUNTER — Telehealth: Payer: Self-pay

## 2020-10-23 ENCOUNTER — Telehealth: Payer: BC Managed Care – PPO | Admitting: Medical

## 2020-10-23 ENCOUNTER — Other Ambulatory Visit: Payer: Self-pay

## 2020-10-23 NOTE — Telephone Encounter (Signed)
Called the patient and scheduled an virtual visit  at [facility]. Informed the patient to attach the form to a mychart message. Jurell Basista,cma

## 2020-10-23 NOTE — Telephone Encounter (Signed)
Informed pt that we received forms  Semi completed forms placed in PCPs inbox for review, completion, sign and date  Pt has virtual visit tomorrow with Jae Dire as he was seen by another provider elsewhere for this so far

## 2020-10-24 ENCOUNTER — Telehealth (INDEPENDENT_AMBULATORY_CARE_PROVIDER_SITE_OTHER): Payer: BC Managed Care – PPO | Admitting: Primary Care

## 2020-10-24 ENCOUNTER — Encounter: Payer: Self-pay | Admitting: Primary Care

## 2020-10-24 ENCOUNTER — Other Ambulatory Visit: Payer: Self-pay

## 2020-10-24 DIAGNOSIS — U071 COVID-19: Secondary | ICD-10-CM

## 2020-10-24 HISTORY — DX: COVID-19: U07.1

## 2020-10-24 NOTE — Assessment & Plan Note (Signed)
Positive result initially on 06/15, is doing very well now. Appears well.  Agree to complete FMLA from 06/15-06/26 with return to work date on 06/27.  All questions answered.

## 2020-10-24 NOTE — Telephone Encounter (Signed)
Informed pt that paperwork was completed and faxed  Copy mailed to pt  Copy for scan   Copy retained by me

## 2020-10-24 NOTE — Progress Notes (Signed)
Patient ID: Joshua Wang, male    DOB: Sep 13, 1990, 30 y.o.   MRN: [id number]  Virtual visit completed through [employer], a video enabled telemedicine application. Due to national recommendations of social distancing due to COVID-19, a virtual visit is felt to be most appropriate for this patient at this time. Reviewed limitations, risks, security and privacy concerns of performing a virtual visit and the availability of in person appointments. I also reviewed that there may be a patient responsible charge related to this service. The patient agreed to proceed.   Patient location: Museum/gallery conservator location: Adult nurse at [facility], office Persons participating in this virtual visit: patient, provider  If any vitals were documented, they were collected by patient at home unless specified below.    Ht 5\' 9"  (1.753 m)   Wt 180 lb (81.6 kg)   BMI 26.58 kg/m    CC: FMLA Subjective:   HPI: Joshua Wang is a 30 y.o. male presenting on 10/24/2020 for Covid Positive (FMLA for covid )  He sent a message via My Chart 2 days ago reporting a positive Covid-19 test on 10/21/20. During this message he was requesting FMLA forms to be completed for work due to his absence, and given this he was routed to me.   He is requesting leave of absence from 06/15 through 06/26 with a return date of 06/27. Overall he's feeling well, symptoms started a few days before the 15th which consisted of nasal congestion, loss of taste/smell, fatigue. He actually tested positive on 10/16/20 and 10/19/20 as well.   He denies fevers. He's been isolating himself. He had previously completed 3 Covid vaccines.      Relevant past medical, surgical, family and social history reviewed and updated as indicated. Interim medical history since our last visit reviewed. Allergies and medications reviewed and updated. Outpatient Medications Prior to Visit  Medication Sig Dispense Refill   buPROPion (WELLBUTRIN XL) 150 MG 24 hr tablet  Take 1 tablet (150 mg total) by mouth daily. For depression 90 tablet 3   COVID-19 mRNA Vac-TriS, Pfizer, SUSP injection USE AS DIRECTED .3 mL 0   fexofenadine (ALLEGRA) 60 MG tablet Take by mouth.     No facility-administered medications prior to visit.     Per HPI unless specifically indicated in ROS section below Review of Systems  Constitutional:  Negative for fever.  HENT:  Positive for congestion. Negative for sore throat.   Respiratory:  Negative for shortness of breath.   Cardiovascular:  Negative for chest pain.  Neurological:  Negative for headaches.  Objective:  Ht 5\' 9"  (1.753 m)   Wt 180 lb (81.6 kg)   BMI 26.58 kg/m   Wt Readings from Last 3 Encounters:  10/24/20 180 lb (81.6 kg)  04/14/19 159 lb 8 oz (72.3 kg)  02/28/19 160 lb (72.6 kg)       Physical exam: Gen: alert, NAD, not ill appearing Pulm: speaks in complete sentences without increased work of breathing Psych: normal mood, normal thought content      Results for orders placed or performed in visit on 06/05/20  Novel Coronavirus, NAA ([facility])   Specimen: Nasopharyngeal(NP) swabs in vial transport medium   Nasopharynge  Screenin  Result Value Ref Range   SARS-CoV-2, NAA Not Detected Not Detected  SARS-COV-2, NAA 2 DAY TAT   Nasopharynge  Screenin  Result Value Ref Range   SARS-CoV-2, NAA 2 DAY TAT Performed    Assessment & Plan:   Problem List  Items Addressed This Visit       Other   COVID-19 virus infection    Positive result initially on 06/15, is doing very well now. Appears well.  Agree to complete FMLA from 06/15-06/26 with return to work date on 06/27.  All questions answered.          No orders of the defined types were placed in this encounter.  No orders of the defined types were placed in this encounter.   I discussed the assessment and treatment plan with the patient. The patient was provided an opportunity to ask questions and all were answered. The patient agreed with  the plan and demonstrated an understanding of the instructions. The patient was advised to call back or seek an in-person evaluation if the symptoms worsen or if the condition fails to improve as anticipated.  Follow up plan:  We will complete your FMLA paper work as discussed.  It was a pleasure to see you today!   Doreene Nest, NP

## 2020-10-24 NOTE — Patient Instructions (Signed)
We will complete your FMLA paper work as discussed.  It was a pleasure to see you today!

## 2020-10-24 NOTE — Telephone Encounter (Signed)
Completed and placed on Joshua Wang's desk 

## 2021-03-25 ENCOUNTER — Ambulatory Visit: Payer: BC Managed Care – PPO | Admitting: Primary Care

## 2021-03-25 ENCOUNTER — Other Ambulatory Visit: Payer: Self-pay

## 2021-03-25 VITALS — BP 106/80 | HR 94 | Temp 97.7°F | Wt 176.0 lb

## 2021-03-25 DIAGNOSIS — R109 Unspecified abdominal pain: Secondary | ICD-10-CM | POA: Diagnosis not present

## 2021-03-25 DIAGNOSIS — G8929 Other chronic pain: Secondary | ICD-10-CM

## 2021-03-25 DIAGNOSIS — K582 Mixed irritable bowel syndrome: Secondary | ICD-10-CM

## 2021-03-25 LAB — CBC
HCT: 45.2 % (ref 39.0–52.0)
Hemoglobin: 15.5 g/dL (ref 13.0–17.0)
MCHC: 34.2 g/dL (ref 30.0–36.0)
MCV: 85.1 fl (ref 78.0–100.0)
Platelets: 212 10*3/uL (ref 150.0–400.0)
RBC: 5.31 Mil/uL (ref 4.22–5.81)
RDW: 12.4 % (ref 11.5–15.5)
WBC: 6.6 10*3/uL (ref 4.0–10.5)

## 2021-03-25 LAB — COMPREHENSIVE METABOLIC PANEL
ALT: 17 U/L (ref 0–53)
AST: 19 U/L (ref 0–37)
Albumin: 5 g/dL (ref 3.5–5.2)
Alkaline Phosphatase: 63 U/L (ref 39–117)
BUN: 13 mg/dL (ref 6–23)
CO2: 29 mEq/L (ref 19–32)
Calcium: 9.7 mg/dL (ref 8.4–10.5)
Chloride: 102 mEq/L (ref 96–112)
Creatinine, Ser: 1.2 mg/dL (ref 0.40–1.50)
GFR: 82.12 mL/min (ref >60.00)
Glucose, Bld: 95 mg/dL (ref 70–99)
Potassium: 3.9 mEq/L (ref 3.5–5.1)
Sodium: 137 mEq/L (ref 135–145)
Total Bilirubin: 2.1 mg/dL — ABNORMAL HIGH (ref 0.2–1.2)
Total Protein: 7.4 g/dL (ref 6.0–8.3)

## 2021-03-25 NOTE — Progress Notes (Signed)
Subjective:    Patient ID: Joshua Wang, male    DOB: 12/27/1990, 30 y.o.   MRN: [id number]  HPI  Joshua Wang is a very pleasant 30 y.o. male with a history of GERD, IBS who presents today to discuss abdominal pain.  His pain is located to the generalized abdomen, mostly starting to the right upper quadrant with radiation to generalized abdomen, and downward to the bilateral lower abdomen.   Pain is sometimes revealed with bowel movements. Also with symptoms of alternating constipation and diarrhea, doesn't have consistent bowel movements. Bowel movements are "ribbon shaped" at times. Symptoms will occur with and without meals.   He describes his pain "like a punch in the gut". He denies bloody stools. His father has similar symptoms.   He's tried changing his diet and taking probiotics and hasn't noticed improved. A few years ago he visited GI, was told he had "IBS" was provided with fiber supplements which caused increase pain.    Review of Systems  Constitutional:  Negative for appetite change and unexpected weight change.  Gastrointestinal:  Positive for abdominal pain, constipation, diarrhea and nausea. Negative for vomiting.        Past Medical History:  Diagnosis Date   Asthma    Chickenpox    GERD (gastroesophageal reflux disease)    IBS (irritable bowel syndrome)    Migraines    Seasonal allergies     Social History   Socioeconomic History   Marital status: Single    Spouse name: Not on file   Number of children: Not on file   Years of education: Not on file   Highest education level: Not on file  Occupational History   Not on file  Tobacco Use   Smoking status: Former   Smokeless tobacco: Current    Types: Chew  Substance and Sexual Activity   Alcohol use: Yes   Drug use: Not on file   Sexual activity: Not on file  Other Topics Concern   Not on file  Social History Narrative   Single.   No children.   Works at [employer], nights.   Enjoys  relaxing.    Social Determinants of Health   Financial Resource Strain: Not on file  Food Insecurity: Not on file  Transportation Needs: Not on file  Physical Activity: Not on file  Stress: Not on file  Social Connections: Not on file  Intimate Partner Violence: Not on file    Past Surgical History:  Procedure Laterality Date   ELBOW SURGERY Right     Family History  Problem Relation Age of Onset   Asthma Mother    Depression Mother    Diabetes Father    Hypertension Father    Diabetes Maternal Grandfather    Hearing loss Maternal Grandfather    Cancer Paternal Grandmother    Cancer Paternal Grandfather    Diabetes Paternal Grandfather     No Known Allergies  Current Outpatient Medications on File Prior to Visit  Medication Sig Dispense Refill   fexofenadine (ALLEGRA) 60 MG tablet Take by mouth.     No current facility-administered medications on file prior to visit.    BP 106/80   Pulse 94   Temp 97.7 F (36.5 C) (Temporal)   Wt 176 lb (79.8 kg)   SpO2 98%   BMI 25.99 kg/m  Objective:   Physical Exam Cardiovascular:     Rate and Rhythm: Normal rate and regular rhythm.  Pulmonary:  Effort: Pulmonary effort is normal.     Breath sounds: Normal breath sounds. No wheezing or rales.  Abdominal:     General: Bowel sounds are increased.     Palpations: Abdomen is soft.     Tenderness: There is no abdominal tenderness.  Musculoskeletal:     Cervical back: Neck supple.  Skin:    General: Skin is warm and dry.  Neurological:     Mental Status: He is alert and oriented to person, place, and time.          Assessment & Plan:      This visit occurred during the SARS-CoV-2 public health emergency.  Safety protocols were in place, including screening questions prior to the visit, additional usage of staff PPE, and extensive cleaning of exam room while observing appropriate contact time as indicated for disinfecting solutions.

## 2021-03-25 NOTE — Patient Instructions (Addendum)
Stop by the lab prior to leaving today. I will notify you of your results once received.   You will be contacted regarding your referral to GI and also for the ultrasound of your gall bladder.  Please let us know if you have not been contacted within two weeks.   Take a look at the diet recommendations below.  It was a pleasure to see you today!  Low-FODMAP Eating Plan FODMAP stands for fermentable oligosaccharides, disaccharides, monosaccharides, and polyols. These are sugars that are hard for some people to digest. A low-FODMAP eating plan may help some people who have irritable bowel syndrome (IBS) and certain other bowel (intestinal) diseases to manage their symptoms. This meal plan can be complicated to follow. Work with a diet and nutrition specialist (dietitian) to make a low-FODMAP eating plan that is right for you. A dietitian can help make sure that you get enough nutrition from this diet. What are tips for following this plan? Reading food labels Check labels for hidden FODMAPs such as: High-fructose syrup. Honey. Agave. Natural fruit flavors. Onion or garlic powder. Choose low-FODMAP foods that contain 3-4 grams of fiber per serving. Check food labels for serving sizes. Eat only one serving at a time to make sure FODMAP levels stay low. Shopping Shop with a list of foods that are recommended on this diet and make a meal plan. Meal planning Follow a low-FODMAP eating plan for up to 6 weeks, or as told by your health care provider or dietitian. To follow the eating plan: Eliminate high-FODMAP foods from your diet completely. Choose only low-FODMAP foods to eat. You will do this for 2-6 weeks. Gradually reintroduce high-FODMAP foods into your diet one at a time. Most people should wait a few days before introducing the next new high-FODMAP food into their meal plan. Your dietitian can recommend how quickly you may reintroduce foods. Keep a daily record of what and how much you  eat and drink. Make note of any symptoms that you have after eating. Review your daily record with a dietitian regularly to identify which foods you can eat and which foods you should avoid. General tips Drink enough fluid each day to keep your urine pale yellow. Avoid processed foods. These often have added sugar and may be high in FODMAPs. Avoid most dairy products, whole grains, and sweeteners. Work with a dietitian to make sure you get enough fiber in your diet. Avoid high FODMAP foods at meals to manage symptoms. Recommended foods Fruits Bananas, oranges, tangerines, lemons, limes, blueberries, raspberries, strawberries, grapes, cantaloupe, honeydew melon, kiwi, papaya, passion fruit, and pineapple. Limited amounts of dried cranberries, banana chips, and shredded coconut. Vegetables Eggplant, zucchini, cucumber, peppers, green beans, bean sprouts, lettuce, arugula, kale, Swiss chard, spinach, collard greens, bok choy, summer squash, potato, and tomato. Limited amounts of corn, carrot, and sweet potato. Green parts of scallions. Grains Gluten-free grains, such as rice, oats, buckwheat, quinoa, corn, polenta, and millet. Gluten-free pasta, bread, or cereal. Rice noodles. Corn tortillas. Meats and other proteins Unseasoned beef, pork, poultry, or fish. Eggs. Tomasa Blase. Tofu (firm) and tempeh. Limited amounts of nuts and seeds, such as almonds, walnuts, [place] nuts, pecans, peanuts, nut butters, pumpkin seeds, chia seeds, and sunflower seeds. Dairy Lactose-free milk, yogurt, and kefir. Lactose-free cottage cheese and ice cream. Non-dairy milks, such as almond, coconut, hemp, and rice milk. Non-dairy yogurt. Limited amounts of goat cheese, brie, mozzarella, parmesan, swiss, and other hard cheeses. Fats and oils Butter-free spreads. Vegetable oils, such as olive,  canola, and sunflower oil. Seasoning and other foods Artificial sweeteners with names that do not end in "ol," such as aspartame,  saccharine, and stevia. Maple syrup, white table sugar, raw sugar, brown sugar, and molasses. Mayonnaise, soy sauce, and tamari. Fresh basil, coriander, parsley, rosemary, and thyme. Beverages Water and mineral water. Sugar-sweetened soft drinks. Small amounts of orange juice or cranberry juice. Black and green tea. Most dry wines. Coffee. The items listed above may not be a complete list of foods and beverages you can eat. Contact a dietitian for more information. Foods to avoid Fruits Fresh, dried, and juiced forms of apple, pear, watermelon, peach, plum, cherries, apricots, blackberries, boysenberries, figs, nectarines, and mango. Avocado. Vegetables Chicory root, artichoke, asparagus, cabbage, snow peas, Brussels sprouts, broccoli, sugar snap peas, mushrooms, celery, and cauliflower. Onions, garlic, leeks, and the white part of scallions. Grains Wheat, including kamut, durum, and semolina. Barley and bulgur. Couscous. Wheat-based cereals. Wheat noodles, bread, crackers, and pastries. Meats and other proteins Fried or fatty meat. Sausage. Cashews and pistachios. Soybeans, baked beans, black beans, chickpeas, kidney beans, fava beans, navy beans, lentils, black-eyed peas, and split peas. Dairy Milk, yogurt, ice cream, and soft cheese. Cream and sour cream. Milk-based sauces. Custard. Buttermilk. Soy milk. Seasoning and other foods Any sugar-free gum or candy. Foods that contain artificial sweeteners such as sorbitol, mannitol, isomalt, or xylitol. Foods that contain honey, high-fructose corn syrup, or agave. Bouillon, vegetable stock, beef stock, and chicken stock. Garlic and onion powder. Condiments made with onion, such as hummus, chutney, pickles, relish, salad dressing, and salsa. Tomato paste. Beverages Chicory-based drinks. Coffee substitutes. Chamomile tea. Fennel tea. Sweet or fortified wines such as port or sherry. Diet soft drinks made with isomalt, mannitol, maltitol, sorbitol, or  xylitol. Apple, pear, and mango juice. Juices with high-fructose corn syrup. The items listed above may not be a complete list of foods and beverages you should avoid. Contact a dietitian for more information. Summary FODMAP stands for fermentable oligosaccharides, disaccharides, monosaccharides, and polyols. These are sugars that are hard for some people to digest. A low-FODMAP eating plan is a short-term diet that helps to ease symptoms of certain bowel diseases. The eating plan usually lasts up to 6 weeks. After that, high-FODMAP foods are reintroduced gradually and one at a time. This can help you find out which foods may be causing symptoms. A low-FODMAP eating plan can be complicated. It is best to work with a dietitian who has experience with this type of plan. This information is not intended to replace advice given to you by your health care provider. Make sure you discuss any questions you have with your health care provider. Document Revised: 09/07/2019 Document Reviewed: 09/07/2019 Elsevier Patient Education  2022 [employer].

## 2021-03-25 NOTE — Assessment & Plan Note (Signed)
Symptoms today resemble IBS. Exam today grossly negative.  Checking labs today. Ultrasound of RUQ for gall bladder evaluation pended. Referral placed back to GI for evaluation.

## 2021-04-07 ENCOUNTER — Encounter: Payer: Self-pay | Admitting: *Deleted

## 2021-04-18 ENCOUNTER — Ambulatory Visit
Admission: RE | Admit: 2021-04-18 | Discharge: 2021-04-18 | Disposition: A | Discharge status: Home / Self Care | Payer: BC Managed Care – PPO | Source: Ambulatory Visit | Attending: Primary Care | Admitting: Primary Care

## 2021-04-18 ENCOUNTER — Other Ambulatory Visit: Payer: Self-pay

## 2021-04-18 DIAGNOSIS — G8929 Other chronic pain: Secondary | ICD-10-CM | POA: Insufficient documentation

## 2021-04-18 DIAGNOSIS — R109 Unspecified abdominal pain: Secondary | ICD-10-CM | POA: Diagnosis not present

## 2021-05-22 ENCOUNTER — Emergency Department: Payer: BC Managed Care – PPO

## 2021-05-22 ENCOUNTER — Ambulatory Visit
Admission: EM | Admit: 2021-05-22 | Discharge: 2021-05-22 | Disposition: A | Discharge status: Other Acute Inpatient Hospital | Payer: BC Managed Care – PPO | Source: Home / Self Care

## 2021-05-22 ENCOUNTER — Other Ambulatory Visit: Payer: Self-pay

## 2021-05-22 ENCOUNTER — Emergency Department
Admission: EM | Admit: 2021-05-22 | Discharge: 2021-05-22 | Disposition: A | Discharge status: Home / Self Care | Payer: BC Managed Care – PPO | Attending: Emergency Medicine | Admitting: Emergency Medicine

## 2021-05-22 DIAGNOSIS — N50812 Left testicular pain: Secondary | ICD-10-CM | POA: Insufficient documentation

## 2021-05-22 DIAGNOSIS — N5089 Other specified disorders of the male genital organs: Secondary | ICD-10-CM | POA: Diagnosis not present

## 2021-05-22 DIAGNOSIS — N503 Cyst of epididymis: Secondary | ICD-10-CM | POA: Diagnosis not present

## 2021-05-22 DIAGNOSIS — N451 Epididymitis: Secondary | ICD-10-CM | POA: Diagnosis not present

## 2021-05-22 LAB — URINALYSIS, COMPLETE (UACMP) WITH MICROSCOPIC
Bacteria, UA: NONE SEEN
Bacteria, UA: NONE SEEN
Bilirubin Urine: NEGATIVE
Bilirubin Urine: NEGATIVE
Glucose, UA: NEGATIVE mg/dL
Glucose, UA: NEGATIVE mg/dL
Hgb urine dipstick: NEGATIVE
Hgb urine dipstick: NEGATIVE
Ketones, ur: NEGATIVE mg/dL
Ketones, ur: NEGATIVE mg/dL
Leukocytes,Ua: NEGATIVE
Leukocytes,Ua: NEGATIVE
Nitrite: NEGATIVE
Nitrite: NEGATIVE
Protein, ur: NEGATIVE mg/dL
Protein, ur: NEGATIVE mg/dL
Specific Gravity, Urine: 1.02 (ref 1.005–1.030)
Specific Gravity, Urine: 1.01 (ref 1.005–1.030)
Squamous Epithelial / HPF: NONE SEEN (ref 0–5)
WBC, UA: NONE SEEN WBC/hpf (ref 0–5)
pH: 7 (ref 5.0–8.0)
pH: 6 (ref 5.0–8.0)

## 2021-05-22 LAB — CHLAMYDIA/NGC RT PCR (ARMC ONLY)
Chlamydia Tr: NOT DETECTED
N gonorrhoeae: NOT DETECTED

## 2021-05-22 MED ORDER — LEVOFLOXACIN 500 MG PO TABS: 500.0000 mg | ORAL_TABLET | Freq: Every day | ORAL | 0 refills | Status: AC

## 2021-05-22 MED ORDER — KETOROLAC TROMETHAMINE 60 MG/2ML IM SOLN
30.0000 mg | Freq: Once | INTRAMUSCULAR | Status: AC
  Administered 2021-05-22: 30 mg via INTRAMUSCULAR
  Filled 2021-05-22: qty 2

## 2021-05-22 MED ORDER — NAPROXEN 500 MG PO TABS: 500.0000 mg | ORAL_TABLET | Freq: Two times a day (BID) | ORAL | 0 refills | Status: DC

## 2021-05-22 NOTE — ED Triage Notes (Signed)
Pt to ED for left testicle pain since Monday. Denies swelling. Denies discharge or urinary sx

## 2021-05-22 NOTE — ED Provider Notes (Signed)
[facility]    CSN: [id number] Arrival date & time: 05/22/21  1622      History   Chief Complaint Chief Complaint  Patient presents with   Testicle Pain    Left     HPI [place] R Threat is a 31 y.o. male.   HPI  31 year old male here for evaluation of testicular pain.  Patient reports that he has been experiencing pain in his left testicle that started Monday.  The pain was constant from Monday until yesterday and then seemed to resolve.  The pain returned when he was at work earlier today.  He also reports that now he is having pain in his left low back.  He denies any pain with urination or penile discharge.  He denies any swelling of his scrotum.  Patient also denies any recent sexual activity.  He does state that the pain increased when he was masturbating.  He does do a lot of heavy lifting at work and he is having to walk with a wide stance due to pain.  Past Medical History:  Diagnosis Date   Asthma    Chickenpox    GERD (gastroesophageal reflux disease)    IBS (irritable bowel syndrome)    Migraines    Seasonal allergies     Patient Active Problem List   Diagnosis Date Noted   COVID-19 virus infection 10/24/2020   Chronic shoulder pain 06/27/2019   MDD (major depressive disorder) 04/14/2019   Preventative health care 04/14/2019   Mild intermittent asthma without complication 06/15/2017   Irritable bowel syndrome with both constipation and diarrhea 06/15/2017   GERD (gastroesophageal reflux disease) 06/15/2017    Past Surgical History:  Procedure Laterality Date   ELBOW SURGERY Right        Home Medications    Prior to Admission medications   Medication Sig Start Date End Date Taking? Authorizing Provider  fexofenadine (ALLEGRA) 60 MG tablet Take by mouth.    [provider]    Family History Family History  Problem Relation Age of Onset   Asthma Mother    Depression Mother    Diabetes Father    Hypertension Father     Diabetes Maternal Grandfather    Hearing loss Maternal Grandfather    Cancer Paternal Grandmother    Cancer Paternal Grandfather    Diabetes Paternal Grandfather     Social History Social History   Tobacco Use   Smoking status: Some Days    Types: Cigarettes   Smokeless tobacco: Current    Types: Chew  Vaping Use   Vaping Use: Never used  Substance Use Topics   Alcohol use: Yes   Drug use: Never     Allergies   Patient has no known allergies.   Review of Systems Review of Systems  Genitourinary:  Positive for testicular pain. Negative for dysuria, penile discharge, penile pain, penile swelling and scrotal swelling.  Musculoskeletal:  Positive for back pain.    Physical Exam Triage Vital Signs ED Triage Vitals  Enc Vitals Group     BP 05/22/21 1630 123/87     Pulse Rate 05/22/21 1630 (!) 108     Resp 05/22/21 1630 18     Temp 05/22/21 1630 98.6 F (37 C)     Temp Source 05/22/21 1630 Oral     SpO2 05/22/21 1630 99 %     Weight 05/22/21 1633 175 lb (79.4 kg)     Height 05/22/21 1633 5\' 9"  (1.753 m)  Head Circumference --      Peak Flow --      Pain Score 05/22/21 1630 9     Pain Loc --      Pain Edu? --      Excl. in GC? --    No data found.  Updated Vital Signs BP 123/87 (BP Location: Left Arm)    Pulse (!) 108    Temp 98.6 F (37 C) (Oral)    Resp 18    Ht 5\' 9"  (1.753 m)    Wt 175 lb (79.4 kg)    SpO2 99%    BMI 25.84 kg/m   Visual Acuity Right Eye Distance:   Left Eye Distance:   Bilateral Distance:    Right Eye Near:   Left Eye Near:    Bilateral Near:     Physical Exam Vitals and nursing note reviewed.  Constitutional:      General: He is not in acute distress.    Appearance: Normal appearance. He is normal weight. He is not ill-appearing.  HENT:     Head: Normocephalic and atraumatic.  Cardiovascular:     Rate and Rhythm: Normal rate and regular rhythm.     Pulses: Normal pulses.     Heart sounds: Normal heart sounds. No murmur  heard.   No friction rub. No gallop.  Pulmonary:     Effort: Pulmonary effort is normal.     Breath sounds: Normal breath sounds. No wheezing, rhonchi or rales.  Genitourinary:    Penis: Normal.   Skin:    General: Skin is warm and dry.     Capillary Refill: Capillary refill takes less than 2 seconds.     Findings: No bruising, erythema or rash.  Neurological:     General: No focal deficit present.     Mental Status: He is alert and oriented to person, place, and time.  Psychiatric:        Mood and Affect: Mood normal.        Behavior: Behavior normal.        Thought Content: Thought content normal.        Judgment: Judgment normal.     UC Treatments / Results  Labs (all labs ordered are listed, but only abnormal results are displayed) Labs Reviewed  URINALYSIS, COMPLETE (UACMP) WITH MICROSCOPIC    EKG   Radiology No results found.  Procedures Procedures (including critical care time)  Medications Ordered in UC Medications - No data to display  Initial Impression / Assessment and Plan / UC Course  I have reviewed the triage vital signs and the nursing notes.  Pertinent labs & imaging results that were available during my care of the patient were reviewed by me and considered in my medical decision making (see chart for details).  Is a nontoxic-appearing 31 year old male here for evaluation of left testicular pain that has been going on for last 4 days.  The pain was constant for the first 3 days, resolved and then returned while patient was at work today.  This is not associated with any dysuria or penile discharge.  Patient denies being sexually active at present.  He states that the pain in his testicle was also present during masturbation but he denies seeing any blood in his ejaculate.  On physical exam patient is a benign cardiopulmonary exam with clear lung sounds in all fields.  No CVA tenderness on exam.  No pain with palpation of the lumbar paraspinous region  bilaterally.  Genital  exam reveals a left testicle that is normal in size but is tender to touch.  There is also some tenderness and swelling to the epididymis complex at the rear of the left testicle.  There is no overlying erythema or edema to the scrotum.  Right testicle is free of tenderness, is normal in size, and there is no tenderness or swelling with palpation of the epididymis.  There is no discharge from the glans penis and there is no rashes or lesions on the penile shaft.  No hernia present on exam.  Suspect patient's pain is secondary to epididymitis but with the 4 day history of pain that resolved and then returned there is a question of whether or not the patient was experiencing intermittent torsion, full torsion, and if he has infarcted his testicle.  For this reason I have advised him to go to the emergency department at [facility] as he will need a scrotal ultrasound which we are unable to perform at the urgent care at this evening.  Patient verbalized understanding and he left ambulatory in stable condition.   Final Clinical Impressions(s) / UC Diagnoses   Final diagnoses:  Pain in left testicle     Discharge Instructions      As we discussed, it is important to evaluate your left testicle to determine if there is alterations in blood flow to the testicle and whether or not the testicle is still viable.  You will need an ultrasound for this.  Therefore, I recommend that she go to the emergency department at [facility] for evaluation.     ED Prescriptions   None    PDMP not reviewed this encounter.   Becky Augusta, NP 05/22/21 1655

## 2021-05-22 NOTE — Discharge Instructions (Addendum)
As we discussed, it is important to evaluate your left testicle to determine if there is alterations in blood flow to the testicle and whether or not the testicle is still viable.  You will need an ultrasound for this.  Therefore, I recommend that she go to the emergency department at [facility] for evaluation.

## 2021-05-22 NOTE — ED Triage Notes (Signed)
Patient is here for "Back Pain", started around the time as pain in testes, ? Also here for "left testicle pain". Testicular pain started  Monday, not following any incident/injury known. Lingering pain all week "not really hurting and too touch". No dysuria. No penile discharge/drainage.

## 2021-05-22 NOTE — ED Provider Notes (Signed)
[facility] Provider Note    Event Date/Time   First MD Initiated Contact with Patient 05/22/21 1835     (approximate)   History   No chief complaint on file.   HPI  Joshua Wang is a 31 y.o. male with history of migraines, asthma, GERD and remaining history as listed in EMR presents to the emergency department for treatment and evaluation of left-sided testicle pain that has been present for the past 3 days.  Testicle has not been red or swollen, just painful.  He has had no discharge.  He has no dysuria.  He has no concern for STI.  No fever or abdominal pain.     Physical Exam   Triage Vital Signs: ED Triage Vitals [05/22/21 1752]  Enc Vitals Group     BP (!) 156/81     Pulse Rate 92     Resp 18     Temp 98.6 F (37 C)     Temp Source Oral     SpO2 98 %     Weight 174 lb 2.6 oz (79 kg)     Height 5\' 9"  (1.753 m)     Head Circumference      Peak Flow      Pain Score 7     Pain Loc      Pain Edu?      Excl. in [place]?     Most recent vital signs: Vitals:   05/22/21 1752 05/22/21 2012  BP: (!) 156/81 132/78  Pulse: 92 89  Resp: 18 18  Temp: 98.6 F (37 C)   SpO2: 98% 97%    General: Awake, no distress.  CV:  Good peripheral perfusion.  Resp:  Normal effort.  Abd:  No distention.  Other:  Left testicle normal in size and color   ED Results / Procedures / Treatments   Labs (all labs ordered are listed, but only abnormal results are displayed) Labs Reviewed  URINALYSIS, COMPLETE (UACMP) WITH MICROSCOPIC - Abnormal; Notable for the following components:      Result Value   Color, Urine STRAW (*)    APPearance CLEAR (*)    All other components within normal limits  CHLAMYDIA/NGC RT PCR ([facility] ONLY)               EKG  Not indicated   RADIOLOGY Ultrasound image of the scrotum and radiology report reviewed by me.   Ultrasound shows normal testicles with normal vascular waveforms.  The epididymis on the left is slightly  enlarged with a region of hypoattenuation favoring mild epididymitis.  Indeterminate area of hypoechogenicity.  Small benign left epididymal cyst   PROCEDURES:  Critical Care performed: No  Procedures   MEDICATIONS ORDERED IN ED: Medications  ketorolac (TORADOL) injection 30 mg (30 mg Intramuscular Given 05/22/21 1949)     IMPRESSION / MDM / ASSESSMENT AND PLAN / ED COURSE  I reviewed the triage vital signs and the nursing notes.                              Differential diagnosis includes, but is not limited to, testicle torsion, epididymitis, varicocele, testicular mass  31 year old male presenting to the emergency department for treatment and evaluation of left-sided testicle pain x3 days.  See HPI for further details.  Plan will be to get an ultrasound.  Patient denies exposure to STIs.  Last intercourse was over a year ago.  Patient states that he has had similar symptoms off and on over the past few months but typically goes away within a day or so.  He became concerned when symptoms did not resolve.  He is also concerned because he had a friend who recently experienced testicular cancer at young age.  Ultrasound shows mild epididymitis.  Urinalysis is negative for bacteria or leukocytes.  Patient is low risk for STI.  Plan will be to treat him with Levaquin 500 mg for 10 days and Naprosyn twice a day.  He was encouraged to follow-up with his primary care provider next week.  He was encouraged to return to the emergency department for symptoms of change or worsen if he is unable to see primary care.      FINAL CLINICAL IMPRESSION(S) / ED DIAGNOSES   Final diagnoses:  Pain in left testicle  Acute epididymitis     Rx / DC Orders   ED Discharge Orders          Ordered    levofloxacin (LEVAQUIN) 500 MG tablet  Daily        05/22/21 1929    naproxen (NAPROSYN) 500 MG tablet  2 times daily with meals        05/22/21 1929             Note:  This document was  prepared using Dragon voice recognition software and may include unintentional dictation errors.   Victorino Dike, FNP 05/22/21 2047    Lucrezia Starch, MD 05/22/21 2049

## 2021-05-22 NOTE — ED Notes (Signed)
Patient is being discharged from the Urgent Care and sent to the Emergency Department via POV . Per Alycia Rossetti, NP, patient is in need of higher level of care to rule out testicular torsion. Patient is aware and verbalizes understanding of plan of care.  Vitals:   05/22/21 1630  BP: 123/87  Pulse: (!) 108  Resp: 18  Temp: 98.6 F (37 C)  SpO2: 99%

## 2021-08-01 ENCOUNTER — Encounter (HOSPITAL_COMMUNITY): Payer: Self-pay | Admitting: Radiology

## 2022-07-14 ENCOUNTER — Ambulatory Visit: Payer: BC Managed Care – PPO | Admitting: Dermatology

## 2022-07-14 VITALS — BP 134/85 | HR 92

## 2022-07-14 DIAGNOSIS — D224 Melanocytic nevi of scalp and neck: Secondary | ICD-10-CM

## 2022-07-14 DIAGNOSIS — B079 Viral wart, unspecified: Secondary | ICD-10-CM | POA: Diagnosis not present

## 2022-07-14 DIAGNOSIS — L308 Other specified dermatitis: Secondary | ICD-10-CM | POA: Diagnosis not present

## 2022-07-14 DIAGNOSIS — D229 Melanocytic nevi, unspecified: Secondary | ICD-10-CM

## 2022-07-14 DIAGNOSIS — Z03818 Encounter for observation for suspected exposure to other biological agents ruled out: Secondary | ICD-10-CM | POA: Diagnosis not present

## 2022-07-14 DIAGNOSIS — R21 Rash and other nonspecific skin eruption: Secondary | ICD-10-CM

## 2022-07-14 DIAGNOSIS — Z79899 Other long term (current) drug therapy: Secondary | ICD-10-CM | POA: Diagnosis not present

## 2022-07-14 DIAGNOSIS — U071 COVID-19: Secondary | ICD-10-CM | POA: Diagnosis not present

## 2022-07-14 MED ORDER — MOMETASONE FUROATE 0.1 % EX CREA: 1.0000 | TOPICAL_CREAM | CUTANEOUS | 0 refills | Status: DC

## 2022-07-14 NOTE — Progress Notes (Signed)
New Patient Visit  Subjective  [place] R Corker is a 32 y.o. male who presents for the following: Rash (Groin, 2-3wks, itchy prn, it looks pink/red, pt not aware of any family history of psoriasis,  He started with Wynzora cream for ~1wk, then went to OTC terbinafine cream then tried Exelderm lotion x 2 days, /ADDENDUM: After patient left office we were advised pt had hx of GA on foot yrs ago ).  The following portions of the chart were reviewed this encounter and updated as appropriate:   Tobacco  Allergies  Meds  Problems  Med Hx  Surg Hx  Fam Hx     Review of Systems:  No other skin or systemic complaints except as noted in HPI or Assessment and Plan.  Objective  Well appearing patient in no apparent distress; mood and affect are within normal limits.  A focused examination was performed including groin, hands, scalp. Relevant physical exam findings are noted in the Assessment and Plan.  R scalp Slightly irregular brown macule  L pubic Hands clear, annular patches with scale groin/genital area     R digit Verrucous papules -- Discussed viral etiology and contagion.    Assessment & Plan  Nevus R scalp Slightly irregular, benign-appearing.  Observation.  Call clinic for new or changing moles.  Recommend daily use of broad spectrum spf 30+ sunscreen to sun-exposed areas.   Recheck at next visit  Rash and other nonspecific skin eruption L pubic Seb Derm vs Psoriasis vs PR  Start Mometasone cr bid aa rash groin for 2 weeks, then decrease to qd up to 5d/wk until clear  Topical steroids (such as triamcinolone, fluocinolone, fluocinonide, mometasone, clobetasol, halobetasol, betamethasone, hydrocortisone) can cause thinning and lightening of the skin if they are used for too long in the same area. Your physician has selected the right strength medicine for your problem and area affected on the body. Please use your medication only as directed by your physician to prevent  side effects.    mometasone (ELOCON) 0.1 % cream - L pubic Apply 1 Application topically as directed. Bid to aa rash groin x 2 weeks, after 2 weeks decrease to qd up to 5 days a week until clear, then prn flares  Skin / nail biopsy - L pubic Type of biopsy: tangential   Informed consent: discussed and consent obtained   Timeout: patient name, date of birth, surgical site, and procedure verified   Procedure prep:  Patient was prepped and draped in usual sterile fashion Prep type:  Isopropyl alcohol Anesthesia: the lesion was anesthetized in a standard fashion   Anesthetic:  1% lidocaine w/ epinephrine 1-100,000 buffered w/ 8.4% NaHCO3 Instrument used: flexible razor blade   Outcome: patient tolerated procedure well   Post-procedure details: sterile dressing applied and wound care instructions given   Dressing type: bandage and bacitracin    Specimen 1 - Surgical pathology Differential Diagnosis: D48.5 Seb Derm vs Psoriasis vs PR Check Margins: No Hands clear, annular patches with scale groin/genital area  Viral warts, unspecified type R digit Viral Wart (HPV) Counseling  Discussed viral / HPV (Human Papilloma Virus) etiology and risk of spread /infectivity to other areas of body as well as to other people.  Multiple treatments and methods may be required to clear warts and it is possible treatment may not be successful.  Treatment risks include discoloration; scarring and there is still potential for wart recurrence.  Discussed LN2, pt declines today  Return for pending bx results.  I, Othelia Pulling, RMA, am acting as scribe for Sarina Ser, MD . Documentation: I have reviewed the above documentation for accuracy and completeness, and I agree with the above.  Sarina Ser, MD

## 2022-07-14 NOTE — Patient Instructions (Addendum)
Wound Care Instructions  Cleanse wound gently with soap and water once a day then pat dry with clean gauze. Apply a thin coat of Petrolatum (petroleum jelly, "Vaseline") over the wound (unless you have an allergy to this). We recommend that you use a new, sterile tube of Vaseline. Do not pick or remove scabs. Do not remove the yellow or white "healing tissue" from the base of the wound.  Cover the wound with fresh, clean, nonstick gauze and secure with paper tape. You may use Band-Aids in place of gauze and tape if the wound is small enough, but would recommend trimming much of the tape off as there is often too much. Sometimes Band-Aids can irritate the skin.  You should call the office for your biopsy report after 1 week if you have not already been contacted.  If you experience any problems, such as abnormal amounts of bleeding, swelling, significant bruising, significant pain, or evidence of infection, please call the office immediately.  FOR ADULT SURGERY PATIENTS: If you need something for pain relief you may take 1 extra strength Tylenol (acetaminophen) AND 2 Ibuprofen (200mg each) together every 4 hours as needed for pain. (do not take these if you are allergic to them or if you have a reason you should not take them.) Typically, you may only need pain medication for 1 to 3 days.     Due to recent changes in healthcare laws, you may see results of your pathology and/or laboratory studies on MyChart before the doctors have had a chance to review them. We understand that in some cases there may be results that are confusing or concerning to you. Please understand that not all results are received at the same time and often the doctors may need to interpret multiple results in order to provide you with the best plan of care or course of treatment. Therefore, we ask that you please give us 2 business days to thoroughly review all your results before contacting the office for clarification. Should  we see a critical lab result, you will be contacted sooner.   If You Need Anything After Your Visit  If you have any questions or concerns for your doctor, please call our main line at [phone] and press option 4 to reach your doctor's medical assistant. If no one answers, please leave a voicemail as directed and we will return your call as soon as possible. Messages left after 4 pm will be answered the following business day.   You may also send us a message via MyChart. We typically respond to MyChart messages within 1-2 business days.  For prescription refills, please ask your pharmacy to contact our office. Our fax number is [phone].  If you have an urgent issue when the clinic is closed that cannot wait until the next business day, you can page your doctor at the number below.    Please note that while we do our best to be available for urgent issues outside of office hours, we are not available 24/7.   If you have an urgent issue and are unable to reach us, you may choose to seek medical care at your doctor's office, retail clinic, urgent care center, or emergency room.  If you have a medical emergency, please immediately call 911 or go to the emergency department.  Pager Numbers  - Dr. Kowalski: [phone]  - Dr. Moye: [phone]  - Dr. Stewart: [phone]  In the event of inclement weather, please call our main line at   [phone] for an update on the status of any delays or closures.  Dermatology Medication Tips: Please keep the boxes that topical medications come in in order to help keep track of the instructions about where and how to use these. Pharmacies typically print the medication instructions only on the boxes and not directly on the medication tubes.   If your medication is too expensive, please contact our office at [phone] option 4 or send us a message through MyChart.   We are unable to tell what your co-pay for medications will be in  advance as this is different depending on your insurance coverage. However, we may be able to find a substitute medication at lower cost or fill out paperwork to get insurance to cover a needed medication.   If a prior authorization is required to get your medication covered by your insurance company, please allow us 1-2 business days to complete this process.  Drug prices often vary depending on where the prescription is filled and some pharmacies may offer cheaper prices.  The website www.goodrx.com contains coupons for medications through different pharmacies. The prices here do not account for what the cost may be with help from insurance (it may be cheaper with your insurance), but the website can give you the price if you did not use any insurance.  - You can print the associated coupon and take it with your prescription to the pharmacy.  - You may also stop by our office during regular business hours and pick up a GoodRx coupon card.  - If you need your prescription sent electronically to a different pharmacy, notify our office through [facility] MyChart or by phone at [phone] option 4.     Si Usted Necesita Algo Despus de Su Visita  Tambin puede enviarnos un mensaje a travs de MyChart. Por lo general respondemos a los mensajes de MyChart en el transcurso de 1 a 2 das hbiles.  Para renovar recetas, por favor pida a su farmacia que se ponga en contacto con nuestra oficina. Nuestro nmero de fax es el [phone].  Si tiene un asunto urgente cuando la clnica est cerrada y que no puede esperar hasta el siguiente da hbil, puede llamar/localizar a su doctor(a) al nmero que aparece a continuacin.   Por favor, tenga en cuenta que aunque hacemos todo lo posible para estar disponibles para asuntos urgentes fuera del horario de oficina, no estamos disponibles las 24 horas del da, los 7 das de la semana.   Si tiene un problema urgente y no puede comunicarse con nosotros, puede  optar por buscar atencin mdica  en el consultorio de su doctor(a), en una clnica privada, en un centro de atencin urgente o en una sala de emergencias.  Si tiene una emergencia mdica, por favor llame inmediatamente al 911 o vaya a la sala de emergencias.  Nmeros de bper  - Dr. Kowalski: [phone]  - Dra. Moye: [phone]  - Dra. Stewart: [phone]  En caso de inclemencias del tiempo, por favor llame a nuestra lnea principal al [phone] para una actualizacin sobre el estado de cualquier retraso o cierre.  Consejos para la medicacin en dermatologa: Por favor, guarde las cajas en las que vienen los medicamentos de uso tpico para ayudarle a seguir las instrucciones sobre dnde y cmo usarlos. Las farmacias generalmente imprimen las instrucciones del medicamento slo en las cajas y no directamente en los tubos del medicamento.   Si su medicamento es muy caro, por favor, pngase en contacto con   nuestra oficina llamando al [phone] y presione la opcin 4 o envenos un mensaje a travs de MyChart.   No podemos decirle cul ser su copago por los medicamentos por adelantado ya que esto es diferente dependiendo de la cobertura de su seguro. Sin embargo, es posible que podamos encontrar un medicamento sustituto a menor costo o llenar un formulario para que el seguro cubra el medicamento que se considera necesario.   Si se requiere una autorizacin previa para que su compaa de seguros cubra su medicamento, por favor permtanos de 1 a 2 das hbiles para completar este proceso.  Los precios de los medicamentos varan con frecuencia dependiendo del lugar de dnde se surte la receta y alguna farmacias pueden ofrecer precios ms baratos.  El sitio web www.goodrx.com tiene cupones para medicamentos de diferentes farmacias. Los precios aqu no tienen en cuenta lo que podra costar con la ayuda del seguro (puede ser ms barato con su seguro), pero el sitio web puede darle el  precio si no utiliz ningn seguro.  - Puede imprimir el cupn correspondiente y llevarlo con su receta a la farmacia.  - Tambin puede pasar por nuestra oficina durante el horario de atencin regular y recoger una tarjeta de cupones de GoodRx.  - Si necesita que su receta se enve electrnicamente a una farmacia diferente, informe a nuestra oficina a travs de MyChart de [place] o por telfono llamando al [phone] y presione la opcin 4.  

## 2022-07-17 ENCOUNTER — Encounter: Payer: Self-pay | Admitting: Dermatology

## 2022-07-20 ENCOUNTER — Telehealth: Payer: Self-pay

## 2022-07-20 NOTE — Telephone Encounter (Signed)
Patient aware of biopsy results, per patient mom patient is doing better.

## 2022-07-20 NOTE — Telephone Encounter (Signed)
-----   Message from Ralene Bathe, MD sent at 07/17/2022  6:28 PM EDT ----- Diagnosis Skin , left pubic SPONGIOTIC DERMATITIS, SEE DESCRIPTION  Spongiotic dermatitis most consistent with NUMMULAR DERMATITIS / Eczema Continue topical Mometasone cream How is he doing?

## 2022-11-14 ENCOUNTER — Ambulatory Visit
Admission: EM | Admit: 2022-11-14 | Discharge: 2022-11-14 | Disposition: A | Discharge status: Home / Self Care | Payer: BC Managed Care – PPO | Attending: Physician Assistant | Admitting: Physician Assistant

## 2022-11-14 DIAGNOSIS — R109 Unspecified abdominal pain: Secondary | ICD-10-CM | POA: Insufficient documentation

## 2022-11-14 DIAGNOSIS — Z113 Encounter for screening for infections with a predominantly sexual mode of transmission: Secondary | ICD-10-CM | POA: Diagnosis not present

## 2022-11-14 DIAGNOSIS — R350 Frequency of micturition: Secondary | ICD-10-CM | POA: Diagnosis not present

## 2022-11-14 LAB — POCT URINALYSIS DIP (MANUAL ENTRY)
Bilirubin, UA: NEGATIVE
Blood, UA: NEGATIVE
Glucose, UA: NEGATIVE mg/dL
Ketones, POC UA: NEGATIVE mg/dL
Leukocytes, UA: NEGATIVE
Nitrite, UA: NEGATIVE
Protein Ur, POC: NEGATIVE mg/dL
Spec Grav, UA: 1.02 (ref 1.010–1.025)
Urobilinogen, UA: 0.2 E.U./dL
pH, UA: 7 (ref 5.0–8.0)

## 2022-11-14 MED ORDER — TAMSULOSIN HCL 0.4 MG PO CAPS: 0.4000 mg | ORAL_CAPSULE | Freq: Every day | ORAL | 0 refills | Status: AC

## 2022-11-14 NOTE — ED Provider Notes (Signed)
Renaldo Fiddler    CSN: [id number] Arrival date & time: 11/14/22  1314      History   Chief Complaint Chief Complaint  Patient presents with   Back Pain    HPI Joshua Wang is a 32 y.o. male.   Patient presents with right flank pain that started 3 to 4 days ago.  He reports pain often feels better after urinating.  He reports no history of kidney stones.  He also request STD testing today.  He denies fever, chills, nausea, vomiting, dysuria, penile discharge.      Past Medical History:  Diagnosis Date   Asthma    Chickenpox    GERD (gastroesophageal reflux disease)    IBS (irritable bowel syndrome)    Migraines    Seasonal allergies     Patient Active Problem List   Diagnosis Date Noted   COVID-19 virus infection 10/24/2020   Chronic shoulder pain 06/27/2019   MDD (major depressive disorder) 04/14/2019   Preventative health care 04/14/2019   Mild intermittent asthma without complication 06/15/2017   Irritable bowel syndrome with both constipation and diarrhea 06/15/2017   GERD (gastroesophageal reflux disease) 06/15/2017    Past Surgical History:  Procedure Laterality Date   ELBOW SURGERY Right        Home Medications    Prior to Admission medications   Medication Sig Start Date End Date Taking? Authorizing Provider  tamsulosin (FLOMAX) 0.4 MG CAPS capsule Take 1 capsule (0.4 mg total) by mouth daily for 10 days. 11/14/22 11/24/22 Yes Ward, Tylene Fantasia, PA-C  fexofenadine (ALLEGRA) 60 MG tablet Take by mouth.    [provider]  mometasone (ELOCON) 0.1 % cream Apply 1 Application topically as directed. Bid to aa rash groin x 2 weeks, after 2 weeks decrease to qd up to 5 days a week until clear, then prn flares 07/14/22   Deirdre Evener, MD  naproxen (NAPROSYN) 500 MG tablet Take 1 tablet (500 mg total) by mouth 2 (two) times daily with a meal. 05/22/21   Triplett, Kasandra Knudsen, FNP    Family History Family History  Problem Relation Age of Onset    Asthma Mother    Depression Mother    Diabetes Father    Hypertension Father    Diabetes Maternal Grandfather    Hearing loss Maternal Grandfather    Cancer Paternal Grandmother    Cancer Paternal Grandfather    Diabetes Paternal Grandfather     Social History Social History   Tobacco Use   Smoking status: Some Days    Types: Cigarettes   Smokeless tobacco: Current    Types: Chew  Vaping Use   Vaping status: Never Used  Substance Use Topics   Alcohol use: Yes   Drug use: Never     Allergies   Patient has no known allergies.   Review of Systems Review of Systems  Constitutional:  Negative for chills and fever.  HENT:  Negative for ear pain and sore throat.   Eyes:  Negative for pain and visual disturbance.  Respiratory:  Negative for cough and shortness of breath.   Cardiovascular:  Negative for chest pain and palpitations.  Gastrointestinal:  Negative for abdominal pain and vomiting.  Genitourinary:  Positive for flank pain. Negative for dysuria and hematuria.  Musculoskeletal:  Negative for arthralgias and back pain.  Skin:  Negative for color change and rash.  Neurological:  Negative for seizures and syncope.  All other systems reviewed and are negative.  Physical Exam Triage Vital Signs ED Triage Vitals [11/14/22 1333]  Encounter Vitals Group     BP 127/83     Systolic BP Percentile      Diastolic BP Percentile      Pulse Rate 80     Resp 18     Temp 98.8 F (37.1 C)     Temp src      SpO2 98 %     Weight      Height      Head Circumference      Peak Flow      Pain Score 0     Pain Loc      Pain Education      Exclude from Growth Chart    No data found.  Updated Vital Signs BP 127/83   Pulse 80   Temp 98.8 F (37.1 C)   Resp 18   SpO2 98%   Visual Acuity Right Eye Distance:   Left Eye Distance:   Bilateral Distance:    Right Eye Near:   Left Eye Near:    Bilateral Near:     Physical Exam Vitals and nursing note reviewed.   Constitutional:      General: He is not in acute distress.    Appearance: He is well-developed.  HENT:     Head: Normocephalic and atraumatic.  Eyes:     Conjunctiva/sclera: Conjunctivae normal.  Cardiovascular:     Rate and Rhythm: Normal rate and regular rhythm.     Heart sounds: No murmur heard. Pulmonary:     Effort: Pulmonary effort is normal. No respiratory distress.     Breath sounds: Normal breath sounds.  Abdominal:     Palpations: Abdomen is soft.     Tenderness: There is no abdominal tenderness.  Musculoskeletal:        General: No swelling.     Cervical back: Neck supple.  Skin:    General: Skin is warm and dry.     Capillary Refill: Capillary refill takes less than 2 seconds.  Neurological:     Mental Status: He is alert.  Psychiatric:        Mood and Affect: Mood normal.      UC Treatments / Results  Labs (all labs ordered are listed, but only abnormal results are displayed) Labs Reviewed  POCT URINALYSIS DIP (MANUAL ENTRY) - Abnormal; Notable for the following components:      Result Value   Color, UA light yellow (*)    All other components within normal limits  URINE CULTURE  CYTOLOGY, (ORAL, ANAL, URETHRAL) ANCILLARY ONLY    EKG   Radiology No results found.  Procedures Procedures (including critical care time)  Medications Ordered in UC Medications - No data to display  Initial Impression / Assessment and Plan / UC Course  I have reviewed the triage vital signs and the nursing notes.  Pertinent labs & imaging results that were available during my care of the patient were reviewed by me and considered in my medical decision making (see chart for details).     Flank pain.  Possible kidney stones.  Flank pain seems to be improved with urination.  Will send in Flomax.  Advised increase fluid intake and limiting caffeine and soda.  Will send urine for culture UA normal.  Penile swab in clinic today, will call with test results and treat  STIs if indicated.  Advise follow-up with PCP Final Clinical Impressions(s) / UC Diagnoses   Final diagnoses:  Flank pain     Discharge Instructions      Take flomax once daily Drink plenty of fluids Limit caffiene and soda Will call with test results If no improvement follow up with Primary Care Physician    ED Prescriptions     Medication Sig Dispense Auth. Provider   tamsulosin (FLOMAX) 0.4 MG CAPS capsule Take 1 capsule (0.4 mg total) by mouth daily for 10 days. 30 capsule Ward, Tylene Fantasia, PA-C      PDMP not reviewed this encounter.   Ward, Tylene Fantasia, PA-C 11/14/22 1406

## 2022-11-14 NOTE — Discharge Instructions (Addendum)
Take flomax once daily Drink plenty of fluids Limit caffiene and soda Will call with test results If no improvement follow up with Primary Care Physician

## 2022-11-14 NOTE — ED Triage Notes (Addendum)
Patient to Urgent Care with complaints of right sided lower back pain. Reports pain would be relieved when he would urinate. Symptoms started 3-4 days ago. Reports feeling febrile the last 2 days. Denies any hx of kidney stones.  Also requests STD testing. No known exposure.

## 2022-11-15 LAB — URINE CULTURE: Culture: NO GROWTH

## 2022-11-16 LAB — CYTOLOGY, (ORAL, ANAL, URETHRAL) ANCILLARY ONLY
Chlamydia: NEGATIVE
Comment: NEGATIVE
Comment: NEGATIVE
Comment: NORMAL
Neisseria Gonorrhea: NEGATIVE
Trichomonas: NEGATIVE

## 2022-11-20 ENCOUNTER — Ambulatory Visit: Payer: BC Managed Care – PPO | Admitting: Primary Care

## 2022-11-27 ENCOUNTER — Encounter: Payer: Self-pay | Admitting: Primary Care

## 2022-11-27 ENCOUNTER — Ambulatory Visit: Payer: BC Managed Care – PPO | Admitting: Primary Care

## 2022-11-27 VITALS — BP 118/84 | HR 65 | Temp 98.4°F | Ht 69.0 in | Wt 175.0 lb

## 2022-11-27 DIAGNOSIS — K582 Mixed irritable bowel syndrome: Secondary | ICD-10-CM

## 2022-11-27 DIAGNOSIS — K219 Gastro-esophageal reflux disease without esophagitis: Secondary | ICD-10-CM

## 2022-11-27 DIAGNOSIS — M25511 Pain in right shoulder: Secondary | ICD-10-CM | POA: Diagnosis not present

## 2022-11-27 DIAGNOSIS — F331 Major depressive disorder, recurrent, moderate: Secondary | ICD-10-CM

## 2022-11-27 DIAGNOSIS — J4520 Mild intermittent asthma, uncomplicated: Secondary | ICD-10-CM

## 2022-11-27 DIAGNOSIS — Z0000 Encounter for general adult medical examination without abnormal findings: Secondary | ICD-10-CM | POA: Diagnosis not present

## 2022-11-27 DIAGNOSIS — G8929 Other chronic pain: Secondary | ICD-10-CM

## 2022-11-27 DIAGNOSIS — Z0001 Encounter for general adult medical examination with abnormal findings: Secondary | ICD-10-CM

## 2022-11-27 LAB — COMPREHENSIVE METABOLIC PANEL
ALT: 18 U/L (ref 0–53)
AST: 16 U/L (ref 0–37)
Albumin: 5 g/dL (ref 3.5–5.2)
Alkaline Phosphatase: 63 U/L (ref 39–117)
BUN: 11 mg/dL (ref 6–23)
CO2: 28 mEq/L (ref 19–32)
Calcium: 9.9 mg/dL (ref 8.4–10.5)
Chloride: 102 mEq/L (ref 96–112)
Creatinine, Ser: 1.19 mg/dL (ref 0.40–1.50)
GFR: 81.98 mL/min (ref >60.00)
Glucose, Bld: 93 mg/dL (ref 70–99)
Potassium: 4.3 mEq/L (ref 3.5–5.1)
Sodium: 138 mEq/L (ref 135–145)
Total Bilirubin: 3 mg/dL — ABNORMAL HIGH (ref 0.2–1.2)
Total Protein: 7.3 g/dL (ref 6.0–8.3)

## 2022-11-27 LAB — CBC
HCT: 45.6 % (ref 39.0–52.0)
Hemoglobin: 15 g/dL (ref 13.0–17.0)
MCHC: 32.9 g/dL (ref 30.0–36.0)
MCV: 86.5 fl (ref 78.0–100.0)
Platelets: 258 10*3/uL (ref 150.0–400.0)
RBC: 5.28 Mil/uL (ref 4.22–5.81)
RDW: 12.3 % (ref 11.5–15.5)
WBC: 6.2 10*3/uL (ref 4.0–10.5)

## 2022-11-27 LAB — TSH: TSH: 1.06 u[IU]/mL (ref 0.35–5.50)

## 2022-11-27 LAB — SEDIMENTATION RATE: Sed Rate: 2 mm/hr (ref 0–15)

## 2022-11-27 MED ORDER — HYOSCYAMINE SULFATE 0.125 MG PO TABS: 0.1250 mg | ORAL_TABLET | Freq: Four times a day (QID) | ORAL | 0 refills | Status: DC | PRN

## 2022-11-27 NOTE — Patient Instructions (Signed)
Stop by the lab prior to leaving today. I will notify you of your results once received.   You will either be contacted via phone regarding your referral to GI, or you may receive a letter on your MyChart portal from our referral team with instructions for scheduling an appointment. Please let us know if you have not been contacted by anyone within two weeks.  You may take hyoscyamine tablets every 6 hours as needed for stomach pain/cramping.  It was a pleasure to see you today!

## 2022-11-27 NOTE — Assessment & Plan Note (Signed)
Controlled.  Continue omeprazole 20 mg daily. Cannot tolerate without omeprazole.

## 2022-11-27 NOTE — Assessment & Plan Note (Signed)
Chronic and continued.  Offered to resume Wellbutrin XL 150 mg with plans to titrate upward versus SSRI treatment. He kindly declines for now, but he will update if he changes his mind.  He declines therapy.

## 2022-11-27 NOTE — Assessment & Plan Note (Signed)
Controlled. No concerns today. Declines albuterol inhaler Rx.  Continue Allegra 60 mg daily.

## 2022-11-27 NOTE — Progress Notes (Signed)
Subjective:    Patient ID: Joshua Wang, male    DOB: [birth year], 32 y.o.   MRN: [id number]  HPI  Joshua Wang is a very pleasant 32 y.o. male who presents today for complete physical and follow up of chronic conditions.  He continues to experience IBS like symptoms.   Symptoms include alternating constipation and diarrhea. For about 3-4 days weekly he will feel constipation with a sensation of incomplete bowel emptying. Bowel movements occur 1-2 times daily, always soft. He will then notice epigastric pain or mid lower abdominal pain with the urge to have diarrhea. He will then have diarrhea and the pain will improve.  Evaluated by GI in the past, was told to increase probiotics and increase fiber. He's tried probiotics and increasing fiber intake without improvement. He tested negative for Celiac disease and h. Pylori in 2018.  He was prescribed hyoscyamine, but does not ever remember taking it.  He continues to experience depression with symptoms of "feeling low", low self esteem, little motivation to do anything, fatigue. He was once managed on Wellbutrin XL 150 mg which helped some with depression, but he stopped taking it as he didn't want to take anything. He's completed CBT for a few months in the past which helped.  He is not interested in resuming.  Immunizations: -Tetanus: Completed in 2020  Diet: Fair diet.  Exercise: No regular exercise.  Eye exam: Completes annually  Dental exam: Completed 1 year ago   BP Readings from Last 3 Encounters:  11/27/22 118/84  11/14/22 127/83  07/14/22 134/85       Review of Systems  Constitutional:  Negative for unexpected weight change.  HENT:  Negative for rhinorrhea.   Respiratory:  Negative for cough and shortness of breath.   Cardiovascular:  Negative for chest pain.  Gastrointestinal:  Positive for abdominal pain, constipation and diarrhea.  Genitourinary:  Negative for difficulty urinating.  Musculoskeletal:  Negative for  arthralgias and myalgias.  Skin:  Negative for rash.  Allergic/Immunologic: Negative for environmental allergies.  Neurological:  Negative for dizziness, numbness and headaches.  Psychiatric/Behavioral:  The patient is not nervous/anxious.        See HPI         Past Medical History:  Diagnosis Date   Asthma    Chickenpox    COVID-19 virus infection 10/24/2020   GERD (gastroesophageal reflux disease)    IBS (irritable bowel syndrome)    Migraines    Seasonal allergies     Social History   Socioeconomic History   Marital status: Single    Spouse name: Not on file   Number of children: Not on file   Years of education: Not on file   Highest education level: Not on file  Occupational History   Not on file  Tobacco Use   Smoking status: Former    Types: Cigarettes   Smokeless tobacco: Current    Types: Chew  Vaping Use   Vaping status: Never Used  Substance and Sexual Activity   Alcohol use: Yes   Drug use: Never   Sexual activity: Yes    Comment: Last encounter: 2022  Other Topics Concern   Not on file  Social History Narrative   Single.   No children.   Works at [employer], nights.   Enjoys relaxing.    Social Determinants of Health   Financial Resource Strain: Not on file  Food Insecurity: Not on file  Transportation Needs: Not on file  Physical Activity:  Not on file  Stress: Not on file  Social Connections: Not on file  Intimate Partner Violence: Not on file    Past Surgical History:  Procedure Laterality Date   ELBOW SURGERY Right     Family History  Problem Relation Age of Onset   Asthma Mother    Depression Mother    Diabetes Father    Hypertension Father    Diabetes Maternal Grandfather    Hearing loss Maternal Grandfather    Cancer Paternal Grandmother    Cancer Paternal Grandfather    Diabetes Paternal Grandfather     No Known Allergies  Current Outpatient Medications on File Prior to Visit  Medication Sig Dispense Refill    fexofenadine (ALLEGRA) 60 MG tablet Take by mouth.     mometasone (ELOCON) 0.1 % cream Apply 1 Application topically as directed. Bid to aa rash groin x 2 weeks, after 2 weeks decrease to qd up to 5 days a week until clear, then prn flares 45 g 0   naproxen (NAPROSYN) 500 MG tablet Take 1 tablet (500 mg total) by mouth 2 (two) times daily with a meal. (Patient not taking: Reported on 11/27/2022) 30 tablet 0   No current facility-administered medications on file prior to visit.    BP 118/84   Pulse 65   Temp 98.4 F (36.9 C) (Temporal)   Ht 5\' 9"  (1.753 m)   Wt 175 lb (79.4 kg)   SpO2 99%   BMI 25.84 kg/m  Objective:   Physical Exam HENT:     Right Ear: Tympanic membrane and ear canal normal.     Left Ear: Tympanic membrane and ear canal normal.     Nose: Nose normal.     Right Sinus: No maxillary sinus tenderness or frontal sinus tenderness.     Left Sinus: No maxillary sinus tenderness or frontal sinus tenderness.  Eyes:     Conjunctiva/sclera: Conjunctivae normal.  Neck:     Thyroid: No thyromegaly.     Vascular: No carotid bruit.  Cardiovascular:     Rate and Rhythm: Normal rate and regular rhythm.     Heart sounds: Normal heart sounds.  Pulmonary:     Effort: Pulmonary effort is normal.     Breath sounds: Normal breath sounds. No wheezing or rales.  Abdominal:     General: Bowel sounds are normal.     Palpations: Abdomen is soft.     Tenderness: There is no abdominal tenderness.  Musculoskeletal:        General: Normal range of motion.     Cervical back: Neck supple.  Skin:    General: Skin is warm and dry.  Neurological:     Mental Status: He is alert and oriented to person, place, and time.     Cranial Nerves: No cranial nerve deficit.     Deep Tendon Reflexes: Reflexes are normal and symmetric.  Psychiatric:        Mood and Affect: Mood normal.           Assessment & Plan:  Encounter for annual general medical examination with abnormal findings in  adult Assessment & Plan: Immunizations UTD.  Discussed the importance of a healthy diet and regular exercise in order for weight loss, and to reduce the risk of further co-morbidity.  Exam stable.  Follow up in 1 year for repeat physical.    Gastroesophageal reflux disease, unspecified whether esophagitis present Assessment & Plan: Controlled.  Continue omeprazole 20 mg daily. Cannot tolerate without  omeprazole.    Mild intermittent asthma without complication Assessment & Plan: Controlled. No concerns today. Declines albuterol inhaler Rx.  Continue Allegra 60 mg daily.    Chronic right shoulder pain Assessment & Plan: Chronic and continued. He has no concerns today.  Reviewed MRI from 2021.   Moderate episode of recurrent major depressive disorder [facility]) Assessment & Plan: Chronic and continued.  Offered to resume Wellbutrin XL 150 mg with plans to titrate upward versus SSRI treatment. He kindly declines for now, but he will update if he changes his mind.  He declines therapy.   Irritable bowel syndrome with both constipation and diarrhea Assessment & Plan: Chronic, no improvement.  Referral placed back to GI for further evaluation and potential colonoscopy.  Start hyoscyamine 0.125 mg tablets as needed. Continue to work on diet.  Checking labs today including TSH, CBC, CMP, sed rate.  Orders: -     Hyoscyamine Sulfate; Take 1 tablet (0.125 mg total) by mouth every 6 (six) hours as needed.  Dispense: 30 tablet; Refill: 0 -     Ambulatory referral to Gastroenterology -     Comprehensive metabolic panel -     CBC -     Sedimentation rate -     TSH        Doreene Nest, NP

## 2022-11-27 NOTE — Assessment & Plan Note (Signed)
Immunizations UTD.  Discussed the importance of a healthy diet and regular exercise in order for weight loss, and to reduce the risk of further co-morbidity.  Exam stable.  Follow up in 1 year for repeat physical.

## 2022-11-27 NOTE — Assessment & Plan Note (Signed)
Chronic and continued. He has no concerns today.  Reviewed MRI from 2021.

## 2022-11-27 NOTE — Assessment & Plan Note (Addendum)
Chronic, no improvement.  Referral placed back to GI for further evaluation and potential colonoscopy.  Start hyoscyamine 0.125 mg tablets as needed. Continue to work on diet.  Checking labs today including TSH, CBC, CMP, sed rate.

## 2023-01-05 ENCOUNTER — Telehealth: Payer: Self-pay

## 2023-01-05 DIAGNOSIS — R21 Rash and other nonspecific skin eruption: Secondary | ICD-10-CM

## 2023-01-05 MED ORDER — MOMETASONE FUROATE 0.1 % EX CREA: 1.0000 | TOPICAL_CREAM | CUTANEOUS | 1 refills | Status: AC

## 2023-01-05 NOTE — Telephone Encounter (Signed)
Patient called requesting refill of Mometasone cream, ok Mometasone cream sent to pharmacy

## 2023-01-28 NOTE — Progress Notes (Signed)
[name], PA-C [address]  [address]  [place] [facility] [place], [place] [address]  Main: [phone]  Fax: [phone]   Gastroenterology Consultation  Referring Provider:     Doreene Nest, NP Primary Care Physician:  Doreene Nest, NP Primary Gastroenterologist:  [name], PA-C  Reason for Consultation:     IBS with diarrhea and constipation        HPI:   Joshua Wang is a 32 y.o. y/o male referred for consultation & management  by Doreene Nest, NP.    Patient is referred to evaluate IBS with abdominal pain, diarrhea and constipation.  Also has history of GERD.  He has had these symptoms for over 10 years.  Takes omeprazole 20 Mg daily which controls his acid reflux.  He has occasional episode of epigastric pain.  Rare episode of solid food dysphagia in the past.  Has generalized abdominal pain throughout the entire upper and lower abdomen, episodic.  No pain today.  He was recently started on hyoscyamine 0.125 Mg as needed by his PCP, however he never filled prescription.  He has had some nausea but no vomiting.  Has abdominal bloating.  He denies alarm symptoms such as rectal bleeding, weight loss, or anemia.  No specific food triggers.  Symptoms include alternating constipation and diarrhea. For about 3-4 days weekly he will feel constipation with a sensation of incomplete bowel emptying. Bowel movements occur 1-2 times daily, always soft. He will then notice epigastric pain or mid lower abdominal pain with the urge to have diarrhea. He will then have diarrhea and the pain will improve.  Evaluated by GI in the past (2018) was told to increase probiotics and increase fiber which did not help.  He has never had an EGD or colonoscopy.  He tested negative for Celiac disease and h. Pylori in 2018.   Labs 11/27/2022 showed normal CBC, CMP, TSH, and sed rate.  RUQ ultrasound 04/2021 showed benign tiny polyps (3 mm) in the gallbladder, otherwise normal.  CBD 3 mm.  Past  Medical History:  Diagnosis Date   Asthma    Chickenpox    COVID-19 virus infection 10/24/2020   GERD (gastroesophageal reflux disease)    IBS (irritable bowel syndrome)    Migraines    Seasonal allergies     Past Surgical History:  Procedure Laterality Date   ELBOW SURGERY Right     Prior to Admission medications   Medication Sig Start Date End Date Taking? Authorizing Provider  fexofenadine (ALLEGRA) 60 MG tablet Take by mouth.   Yes [provider]  mometasone (ELOCON) 0.1 % cream Apply 1 Application topically as directed. Bid to aa rash groin x 2 weeks, after 2 weeks decrease to qd up to 5 days a week until clear, then prn flares 01/05/23  Yes Deirdre Evener, MD     Family History  Problem Relation Age of Onset   Asthma Mother    Depression Mother    Diabetes Father    Hypertension Father    Diabetes Maternal Grandfather    Hearing loss Maternal Grandfather    Cancer Paternal Grandmother    Cancer Paternal Grandfather    Diabetes Paternal Grandfather      Social History   Tobacco Use   Smoking status: Former    Types: Cigarettes   Smokeless tobacco: Current    Types: Chew  Vaping Use   Vaping status: Never Used  Substance Use Topics   Alcohol use: Yes  Drug use: Never    Allergies as of 01/29/2023   (No Known Allergies)    Review of Systems:    All systems reviewed and negative except where noted in HPI.   Physical Exam:  BP 112/73   Pulse 80   Temp 98.2 F (36.8 C)   Ht 5\' 9"  (1.753 m)   Wt 174 lb 12.8 oz (79.3 kg)   BMI 25.81 kg/m  No LMP for male patient.  General:   Alert,  Well-developed, well-nourished, pleasant and cooperative in NAD Lungs:  Respirations even and unlabored.  Clear throughout to auscultation.   No wheezes, crackles, or rhonchi. No acute distress. Heart:  Regular rate and rhythm; no murmurs, clicks, rubs, or gallops. Abdomen:  Normal bowel sounds.  No bruits.  Soft, and non-distended without masses,  hepatosplenomegaly or hernias noted.  No Tenderness.  No guarding or rebound tenderness.    Neurologic:  Alert and oriented x3;  grossly normal neurologically. Psych:  Alert and cooperative.  Depressed mood and affect.  Imaging Studies: See HPI.  Assessment and Plan:  Joshua Wang is a 32 y.o. y/o male has been referred for chronic abdominal pain, diarrhea, and constipation.  Symptoms most consistent with irritable bowel syndrome.  Also has history of Chronic GERD.  Irritable bowel syndrome, mixed, diarrhea alternating with constipation;  Start Benefiber powder, mix 1 or 2 tablespoons in a drink once daily.  Low FODMAP diet given.  If symptoms persist, consider trial of Xifaxan or amitriptyline in the future.  GERD; chronic for over 10 years; improved on omeprazole 20 Mg daily.  Continue omeprazole 20 Mg 1 tablet once daily  Recommend Lifestyle Modifications to prevent Acid Reflux.  Rec. Avoid coffee, sodas, peppermint, citrus fruits, and spicey foods.  Avoid eating 2-3 hours before bedtime.   Generalized abdominal pain Lab: Alpha gal, food allergy panel  No tenderness on exam today; recent normal labs;  Negative celiac and H. pylori in 2018  Try hyoscyamine 0.125 mg 3 times daily PRN Abdominal cramping  Diarrhea and constipation  Stool test fecal calprotectin; evaluate for IBD  Start Benefiber powder 1 to 2 tablespoons intermittent once daily.  5.  Dysphagia and epigastric pain; rare episodes  Upper GI series with tablet  Consider EGD if symptoms persist  Follow up in 4 weeks with TG.  [name], PA-C

## 2023-01-29 ENCOUNTER — Encounter: Payer: Self-pay | Admitting: Physician Assistant

## 2023-01-29 ENCOUNTER — Ambulatory Visit: Payer: BC Managed Care – PPO | Admitting: Physician Assistant

## 2023-01-29 ENCOUNTER — Other Ambulatory Visit: Payer: Self-pay | Admitting: Physician Assistant

## 2023-01-29 VITALS — BP 112/73 | HR 80 | Temp 98.2°F | Ht 69.0 in | Wt 174.8 lb

## 2023-01-29 DIAGNOSIS — K5900 Constipation, unspecified: Secondary | ICD-10-CM

## 2023-01-29 DIAGNOSIS — K582 Mixed irritable bowel syndrome: Secondary | ICD-10-CM | POA: Diagnosis not present

## 2023-01-29 DIAGNOSIS — R1013 Epigastric pain: Secondary | ICD-10-CM | POA: Diagnosis not present

## 2023-01-29 DIAGNOSIS — K219 Gastro-esophageal reflux disease without esophagitis: Secondary | ICD-10-CM | POA: Diagnosis not present

## 2023-01-29 DIAGNOSIS — R1310 Dysphagia, unspecified: Secondary | ICD-10-CM

## 2023-01-29 DIAGNOSIS — R1084 Generalized abdominal pain: Secondary | ICD-10-CM

## 2023-01-29 DIAGNOSIS — R197 Diarrhea, unspecified: Secondary | ICD-10-CM

## 2023-01-29 MED ORDER — HYOSCYAMINE SULFATE 0.125 MG PO TABS: 0.1250 mg | ORAL_TABLET | Freq: Three times a day (TID) | ORAL | 1 refills | Status: DC | PRN

## 2023-01-29 NOTE — Patient Instructions (Signed)
Upper GI series scheduled 03/08/23 @ 8:45 [facility]. Nothing to eat/drink after midnight.

## 2023-01-29 NOTE — Progress Notes (Signed)
Med Refill

## 2023-02-05 ENCOUNTER — Ambulatory Visit
Admission: RE | Admit: 2023-02-05 | Discharge: 2023-02-05 | Disposition: A | Discharge status: Home / Self Care | Payer: BC Managed Care – PPO | Source: Ambulatory Visit | Attending: Physician Assistant | Admitting: Physician Assistant

## 2023-02-05 DIAGNOSIS — R1310 Dysphagia, unspecified: Secondary | ICD-10-CM | POA: Diagnosis not present

## 2023-02-05 DIAGNOSIS — K219 Gastro-esophageal reflux disease without esophagitis: Secondary | ICD-10-CM | POA: Diagnosis not present

## 2023-02-05 NOTE — Progress Notes (Signed)
Call and notify patient upper GI series shows a mild stricture just above the GE junction.  No obstruction or masses.  No significant acid reflux or hiatal hernia.  Stomach and duodenum are normal.  I recommend go ahead and schedule EGD with possible dilation to further evaluate mild esophageal stricture.

## 2023-02-08 ENCOUNTER — Telehealth: Payer: Self-pay

## 2023-02-08 NOTE — Telephone Encounter (Signed)
Left message for patient to return cal;l to office.  Call and notify patient upper GI series shows a mild stricture just above the GE junction.  No obstruction or masses.  No significant acid reflux or hiatal hernia.  Stomach and duodenum are normal.  I recommend go ahead and schedule EGD with possible dilation to further evaluate mild esophageal stricture.

## 2023-02-09 ENCOUNTER — Telehealth: Payer: Self-pay

## 2023-02-09 NOTE — Telephone Encounter (Signed)
Left message for patient to return cal;l to office.  Call and notify patient upper GI series shows a mild stricture just above the GE junction.  No obstruction or masses.  No significant acid reflux or hiatal hernia.  Stomach and duodenum are normal.  I recommend go ahead and schedule EGD with possible dilation to further evaluate mild esophageal stricture.

## 2023-02-09 NOTE — Telephone Encounter (Signed)
Spoke with patient this morning- he does not want to have EGD done- he stated he has colon cancer in his family and he wanted to have Colonoscopy. I let him know that I would send Inetta Fermo a message and let him know what is decided.   Call and notify patient upper GI series shows a mild stricture just above the GE junction.  No obstruction or masses.  No significant acid reflux or hiatal hernia.  Stomach and duodenum are normal.  I recommend go ahead and schedule EGD with possible dilation to further evaluate mild esophageal stricture.

## 2023-02-10 ENCOUNTER — Telehealth: Payer: Self-pay

## 2023-02-10 NOTE — Telephone Encounter (Signed)
Spoke with patient encouraged to complete test.-  Please call and notify patient, colonoscopy is not justified at this time.  Reasons to do a colonoscopy include rectal bleeding,and  iron deficiency anemia.  His recent labs showed normal hemoglobin 15.0, normal hemoglobin.   -First screening colonoscopy for colon cancer is recommended at age 32.  If he has family history of colon cancer in a first-degree relative, then colonoscopy would be recommended 10 years prior to the age that relative was diagnosed.  For example if his mother had colon cancer age 6, then he should start colonoscopy at age 58.   -I strongly recommend for patient to go ahead and complete the lab work and fecal calprotectin stool test that I had ordered.  If the fecal calprotectin stool test is elevated, then that can be a sign of inflammatory bowel disease and that would justify [place] ordering a colonoscopy.  Insurance will not cover a colonoscopy unless we have a good reason to justify the procedure.  Please follow-up if his GI symptoms are not improving.  We are happy to order more test if symptoms are not improving. [name], PA-C

## 2023-03-19 ENCOUNTER — Ambulatory Visit: Payer: BC Managed Care – PPO | Admitting: Physician Assistant

## 2023-04-22 ENCOUNTER — Ambulatory Visit: Payer: BC Managed Care – PPO | Admitting: Physician Assistant

## 2023-04-22 NOTE — Progress Notes (Deleted)
    [name], PA-C [address]  [address]  [place], [place] [address]  Main: [phone]  Fax: [phone]   Primary Care Physician: Doreene Nest, NP  Primary Gastroenterologist:  ***  CC: F/U IBS and GERD  HPI: Joshua Wang is a 32 y.o. male returns for 40-month follow-up of IBS.  He has episodes of diarrhea and constipation.  History of GERD.  GI symptoms for over 10 years.  GERD is controlled on omeprazole 20 mg daily.  3 months ago he was advised to start low FODMAP diet, Benefiber powder.  Labs were ordered to check for alpha gal, food allergies, celiac, and fecal calprotectin.  Patient did NOT complete these tests.  Started on hyoscyamine 0.125 Mg 3 times daily as needed abdominal cramping.    Upper GI series with tablet 02/05/2023 showed mild stricture just proximal to GE junction.  No GERD.  Otherwise normal.  He tested negative for Celiac disease and h. Pylori in 2018.    Labs 11/27/2022 showed normal CBC, CMP, TSH, and sed rate.   RUQ ultrasound 04/2021 showed benign tiny polyps (3 mm) in the gallbladder, otherwise normal.  CBD 3 mm.  Current Outpatient Medications  Medication Sig Dispense Refill   fexofenadine (ALLEGRA) 60 MG tablet Take by mouth.     mometasone (ELOCON) 0.1 % cream Apply 1 Application topically as directed. Bid to aa rash groin x 2 weeks, after 2 weeks decrease to qd up to 5 days a week until clear, then prn flares 45 g 1   No current facility-administered medications for this visit.    Allergies as of 04/22/2023   (No Known Allergies)    Past Medical History:  Diagnosis Date   Asthma    Chickenpox    COVID-19 virus infection 10/24/2020   GERD (gastroesophageal reflux disease)    IBS (irritable bowel syndrome)    Migraines    Seasonal allergies     Past Surgical History:  Procedure Laterality Date   ELBOW SURGERY Right     Review of Systems:    All systems reviewed and negative except where noted in HPI.   Physical  Examination:   There were no vitals taken for this visit.  General: Well-nourished, well-developed in no acute distress.  Lungs: Clear to auscultation bilaterally. Non-labored. Heart: Regular rate and rhythm, no murmurs rubs or gallops.  Abdomen: Bowel sounds are normal; Abdomen is Soft; No hepatosplenomegaly, masses or hernias;  No Abdominal Tenderness; No guarding or rebound tenderness. Neuro: Alert and oriented x 3.  Grossly intact.  Psych: Alert and cooperative, normal mood and affect.   Imaging Studies: No results found.  Assessment and Plan:   Joshua R Maudlin is a 32 y.o. y/o male returns for follow-up of:  1.  Irritable bowel syndrome, mixed, diarrhea and constipation  If persistent symptoms, then go ahead and complete labs for alpha gal, food allergy panel, and stool test fecal calprotectin.  Continue low FODMAP diet.  Continue Benefiber.  Align probiotic once daily.  2.  GERD  Continue omeprazole 20 mg daily.  GERD diet.   3.  Mild distal esophageal stricture  Scheduling EGD with possible dilation. I discussed risks of EGD with patient to include risk of bleeding, perforation, and risk of sedation.  Patient expressed understanding and agrees to proceed with EGD.   [name], PA-C  Follow up ***  BP check ***

## 2023-06-20 IMAGING — US US ABDOMEN LIMITED
1 series · 14 of 25 positions shown · non-contrast
Comparison: None.

CLINICAL DATA: Chronic abdominal pain.

EXAM:
ULTRASOUND ABDOMEN LIMITED RIGHT UPPER QUADRANT

[Series 1: us abdomen limited · 0.20mm/px · 14 of 60 slices shown]
[im 1/60]
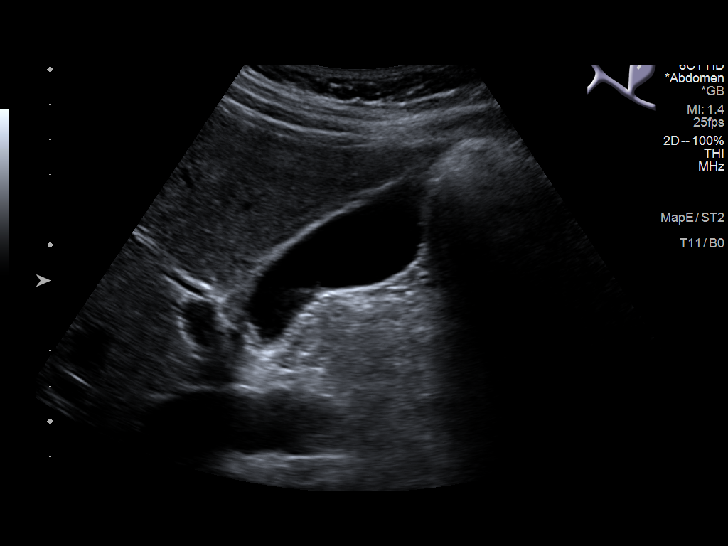
[im 5/60]
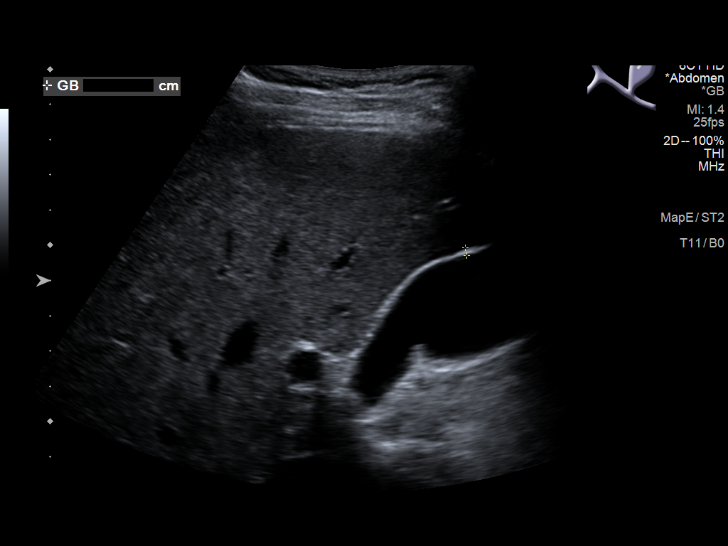
[im 10/60]
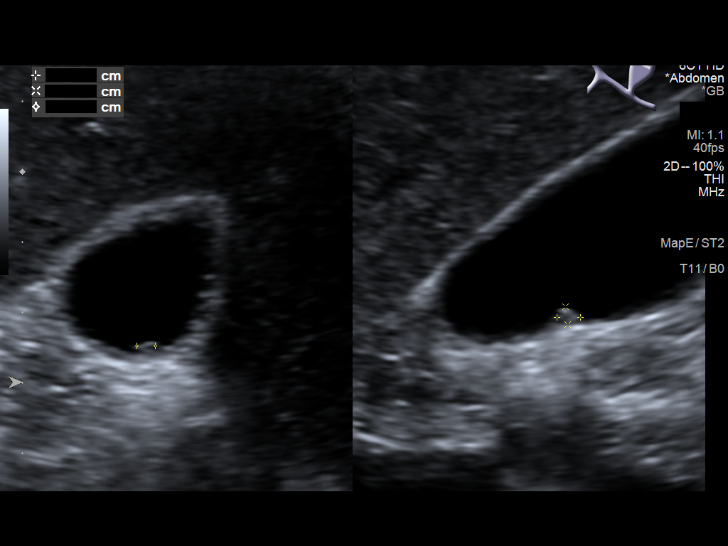
[im 15/60]
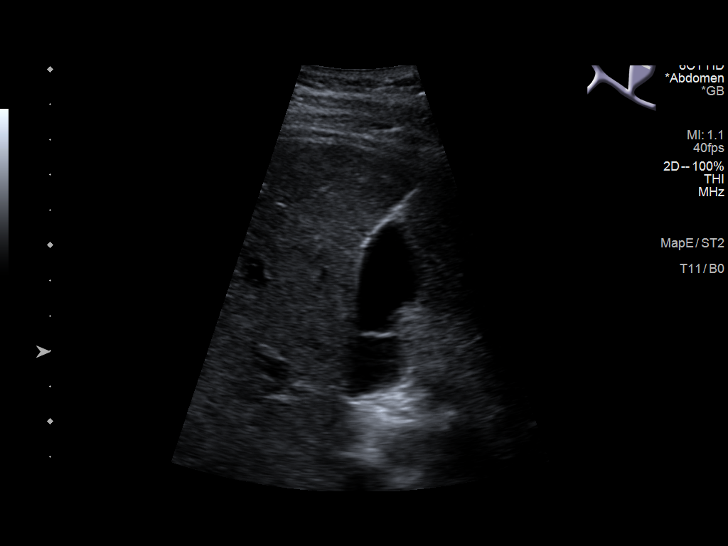
[im 20/60]
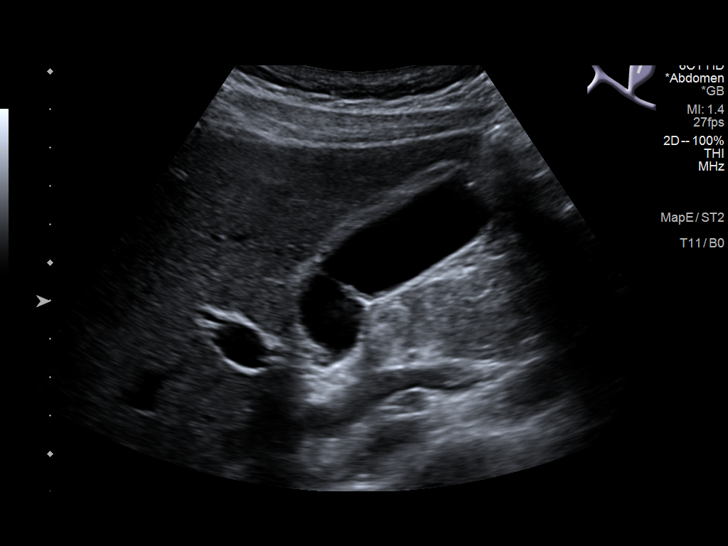
[im 23/60]
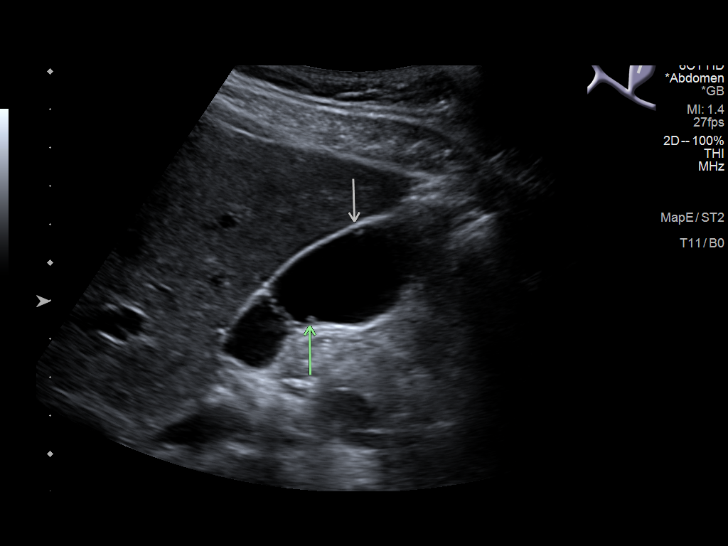
[im 28/60]
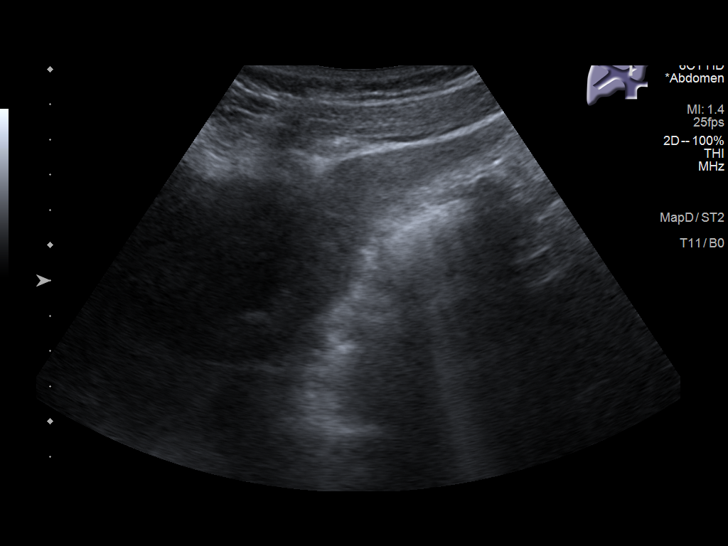
[im 32/60]
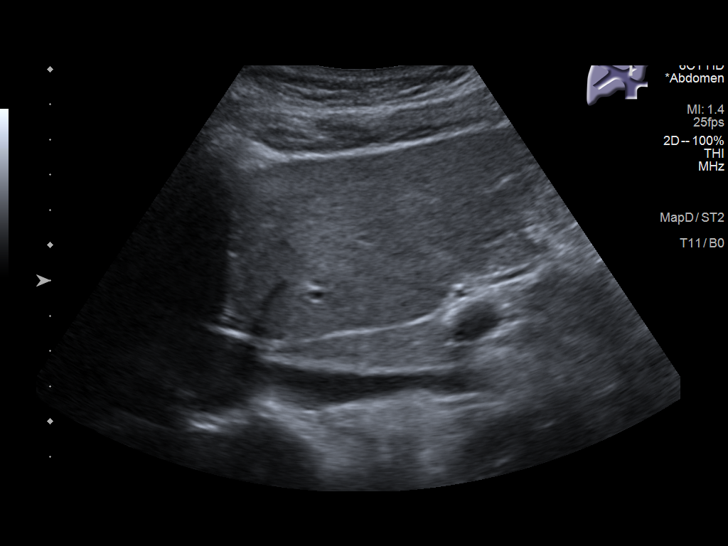
[im 37/60]
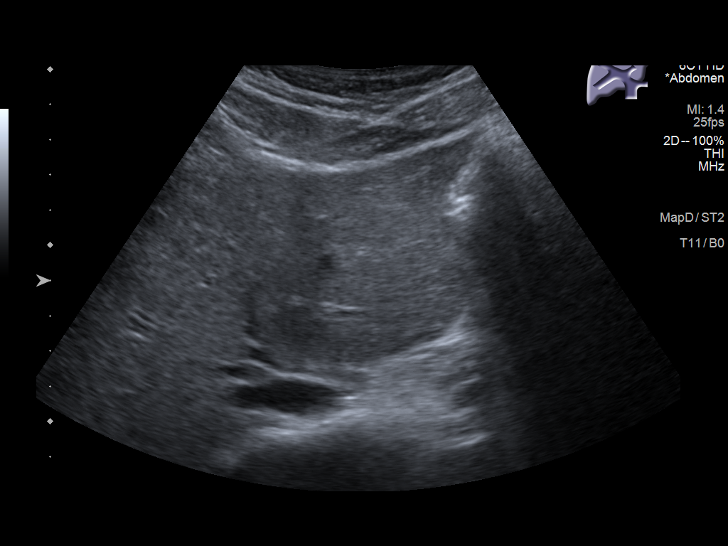
[im 40/60]
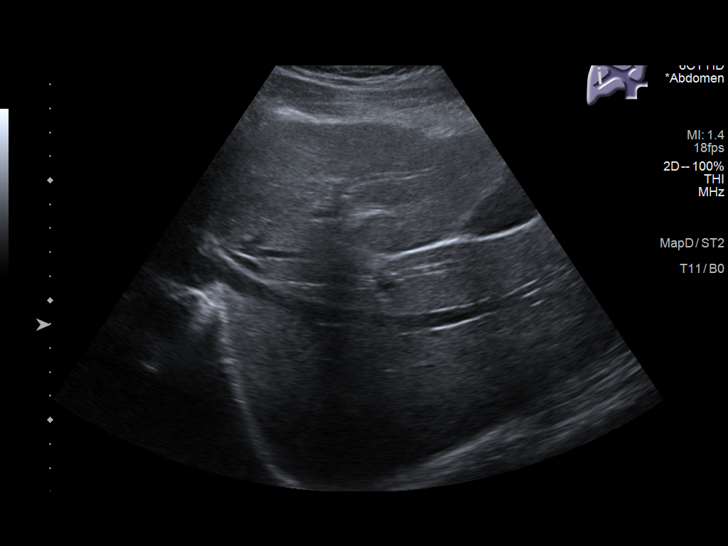
[im 45/60]
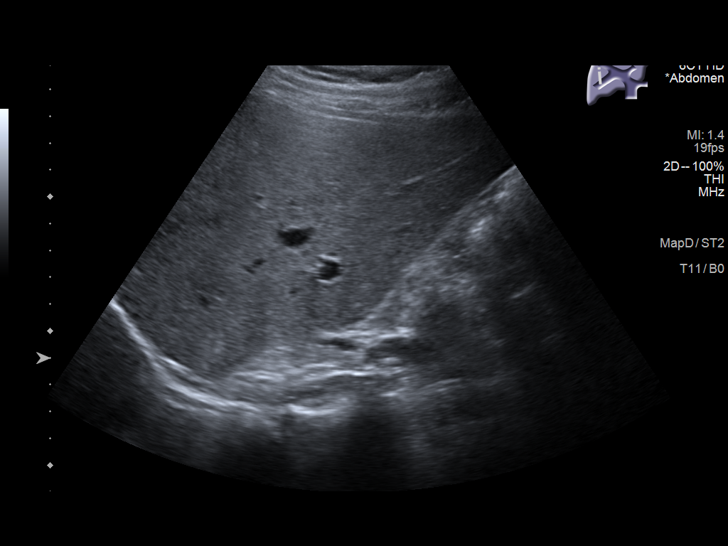
[im 50/60]
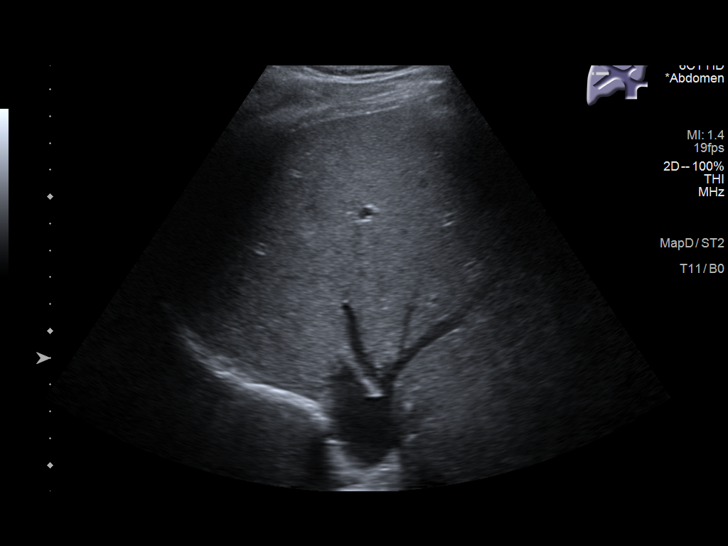
[im 55/60]
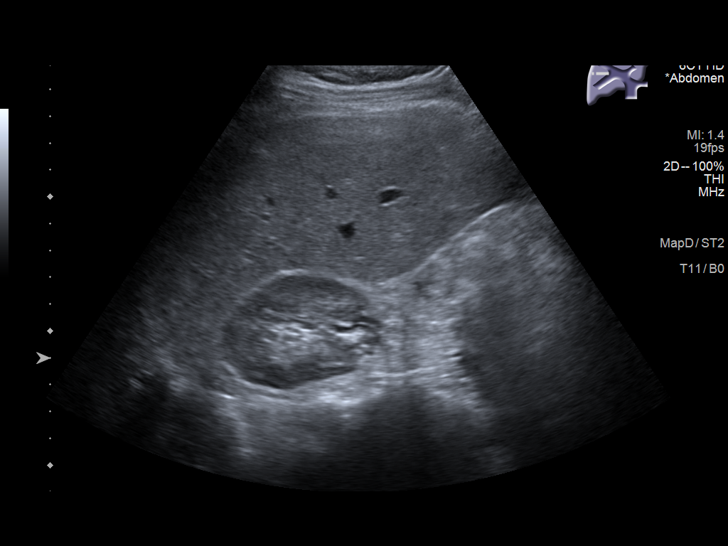
[im 60/60]
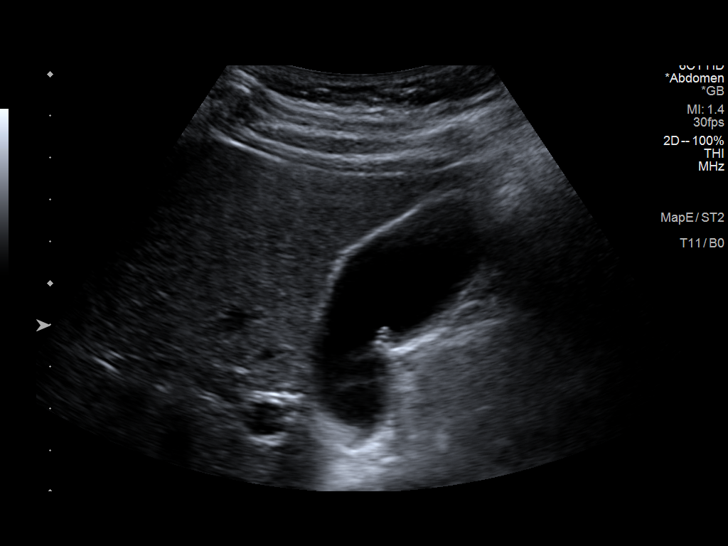

[14 of 25 positions shown; findings below may reference images not displayed]

FINDINGS: Gallbladder:

No gallstones or wall thickening visualized. There is a couple of
tiny benign polyps measuring up to 3 mm. No sonographic Murphy sign
noted by sonographer.

Common bile duct:

Diameter: 0.3 cm, within normal limits

Liver:

No focal lesion identified. Within normal limits in parenchymal
echogenicity. Portal vein is patent on color Doppler imaging with
normal direction of blood flow towards the liver.

Other: None.
IMPRESSION: 1. Benign tiny polyps in the gallbladder. No dedicated follow-up
necessary.

2.  No sonographic finding to explain the patient's abdominal pain.
